# Patient Record
Sex: Female | Born: 1990 | Race: Black or African American | Hispanic: No | Marital: Single | Smoking: Never smoker
Health system: Southern US, Community
[De-identification: ages and names within clinical notes are randomized; demographics above are authoritative.]

## PROBLEM LIST (undated history)

## (undated) HISTORY — PX: MOUTH SURGERY: SHX715

---

## 2019-07-29 ENCOUNTER — Encounter: Payer: Self-pay | Admitting: *Deleted

## 2019-07-29 ENCOUNTER — Ambulatory Visit (INDEPENDENT_AMBULATORY_CARE_PROVIDER_SITE_OTHER): Payer: Medicaid Other | Admitting: Certified Nurse Midwife

## 2019-07-29 ENCOUNTER — Other Ambulatory Visit: Payer: Self-pay

## 2019-07-29 ENCOUNTER — Encounter: Payer: Self-pay | Admitting: Certified Nurse Midwife

## 2019-07-29 ENCOUNTER — Other Ambulatory Visit: Payer: Medicaid Other

## 2019-07-29 ENCOUNTER — Other Ambulatory Visit (HOSPITAL_COMMUNITY)
Admission: RE | Admit: 2019-07-29 | Discharge: 2019-07-29 | Disposition: A | Discharge status: Home / Self Care | Payer: Medicaid Other | Source: Ambulatory Visit | Attending: Certified Nurse Midwife | Admitting: Certified Nurse Midwife

## 2019-07-29 VITALS — BP 113/72 | HR 87 | Ht 70.0 in | Wt 135.0 lb

## 2019-07-29 DIAGNOSIS — Z3400 Encounter for supervision of normal first pregnancy, unspecified trimester: Secondary | ICD-10-CM | POA: Insufficient documentation

## 2019-07-29 DIAGNOSIS — R8271 Bacteriuria: Secondary | ICD-10-CM

## 2019-07-29 DIAGNOSIS — Z3402 Encounter for supervision of normal first pregnancy, second trimester: Secondary | ICD-10-CM

## 2019-07-29 DIAGNOSIS — A568 Sexually transmitted chlamydial infection of other sites: Secondary | ICD-10-CM

## 2019-07-29 DIAGNOSIS — Z3A14 14 weeks gestation of pregnancy: Secondary | ICD-10-CM | POA: Diagnosis not present

## 2019-07-29 HISTORY — DX: Encounter for supervision of normal first pregnancy, unspecified trimester: Z34.00

## 2019-07-29 MED ORDER — BLOOD PRESSURE KIT DEVI: 1.0000 | 0 refills | Status: AC

## 2019-07-29 NOTE — Patient Instructions (Signed)

## 2019-07-29 NOTE — Progress Notes (Signed)
Bedside U/S shows single IUP measuring [redacted]w[redacted]d HC measures 10.18cm

## 2019-07-29 NOTE — Progress Notes (Addendum)
   Subjective:   Colleen Wilcox is a 28 y.o. G1P0 at [redacted]w[redacted]d by LMP confirmed by ultrasound being seen today for her first obstetrical visit.  Patient does intend to breast feed. Pregnancy history fully reviewed.  Patient reports no complaints.  HISTORY: OB History  Gravida Para Term Preterm AB Living  1 0 0 0 0 0  SAB TAB Ectopic Multiple Live Births  0 0 0 0 0    # Outcome Date GA Lbr Len/2nd Weight Sex Delivery Anes PTL Lv  1 Current            History reviewed. No pertinent past medical history. Past Surgical History:  Procedure Laterality Date  . MOUTH SURGERY     Family History  Problem Relation Age of Onset  . Head & neck cancer Mother    Social History   Tobacco Use  . Smoking status: Never Smoker  . Smokeless tobacco: Never Used  Substance Use Topics  . Alcohol use: Not Currently  . Drug use: Never   No Known Allergies No current outpatient medications on file prior to visit.   No current facility-administered medications on file prior to visit.     Indications for ASA therapy (per uptodate) One of the following: Previous pregnancy with preeclampsia, especially early onset and with an adverse outcome No Multifetal gestation No Chronic hypertension No Type 1 or 2 diabetes mellitus No Chronic kidney disease No Autoimmune disease (antiphospholipid syndrome, systemic lupus erythematosus) No  Indications for early 1 hour GTT (per uptodate)  BMI >25 (>23 in Asian women) AND one of the following  Gestational diabetes mellitus in a previous pregnancy No Glycated hemoglobin ?5.7 percent (39 mmol/mol), impaired glucose tolerance, or impaired fasting glucose on previous testing No First-degree relative with diabetes No High-risk race/ethnicity (eg, African American, Latino, Native American, Asian American, Pacific Islander) Yes History of cardiovascular disease No Hypertension or on therapy for hypertension No High-density lipoprotein cholesterol level <35  mg/dL (0.90 mmol/L) and/or a triglyceride level >250 mg/dL (2.82 mmol/L) No Polycystic ovary syndrome No Physical inactivity No Other clinical condition associated with insulin resistance (eg, severe obesity, acanthosis nigricans) No Previous birth of an infant weighing ?4000 g No Previous stillbirth of unknown cause No   Exam   Vitals:   07/29/19 0902 07/29/19 0903  BP: 113/72   Pulse: 87   Weight: 61.2 kg   Height:  5' 10" (1.778 m)   Fetal Heart Rate (bpm): 149  System: General: well-developed, well-nourished female in no acute distress   Breast:  normal appearance, no masses or tenderness   Skin: normal coloration and turgor, no rashes   Neurologic: oriented, normal, negative, normal mood   Extremities: normal strength, tone, and muscle mass, ROM of all joints is normal   HEENT PERRLA, extraocular movement intact and sclera clear, anicteric   Mouth/Teeth mucous membranes moist, pharynx normal without lesions and dental hygiene good   Neck supple and no masses   Cardiovascular: regular rate and rhythm   Respiratory:  no respiratory distress, normal breath sounds   Abdomen: soft, non-tender; bowel sounds normal; no masses,  no organomegaly     Assessment:   Pregnancy: G1P0 Patient Active Problem List   Diagnosis Date Noted  . Supervision of normal first pregnancy 07/29/2019     Plan:  1. Supervision of normal first pregnancy, antepartum - New OB appointment - Pt is very excited about this pregnancy! - No complaints today - Interested in genetic screening - New OB   labs - Anatomy scan ordered   - Obstetric panel - HIV antibody (with reflex) - Cervicovaginal ancillary only( [place]) - [place] bedside; Future - Culture, OB Urine - Babyscripts Schedule Optimization - Blood Pressure Monitoring (BLOOD PRESSURE KIT) DEVI; 1 Device by Does not apply route once a week.  Dispense: 1 Device; Refill: 0 - Procedure Report - Scanned - Procedure Report - Scanned - [place] MFM  OB COMP + 14 WK; Future - Genetic Screening   Initial labs drawn. Early 1 hour GTT: no Started on ASA: no Flu vax: not done Continue prenatal vitamins. Genetic Screening discussed, NIPS: requested. Ultrasound discussed; fetal anatomic survey: ordered. Problem list reviewed and updated The nature of [place] [facility] with multiple MDs and other Advanced Practice Providers was explained to patient; also emphasized that fellows, residents, and students are part of our team. Routine obstetric precautions reviewed Return in about 5 weeks (around 09/02/2019) for ROB/AFP.   Renee Harder, SNM 10:23 AM 07/29/19

## 2019-07-30 ENCOUNTER — Encounter: Payer: Self-pay | Admitting: Certified Nurse Midwife

## 2019-07-30 ENCOUNTER — Encounter: Payer: Self-pay | Admitting: Obstetrics and Gynecology

## 2019-07-30 ENCOUNTER — Other Ambulatory Visit: Payer: Self-pay

## 2019-07-30 DIAGNOSIS — A568 Sexually transmitted chlamydial infection of other sites: Secondary | ICD-10-CM | POA: Insufficient documentation

## 2019-07-30 HISTORY — DX: Sexually transmitted chlamydial infection of other sites: A56.8

## 2019-07-30 LAB — HIV ANTIBODY (ROUTINE TESTING W REFLEX): HIV 1&2 Ab, 4th Generation: NONREACTIVE

## 2019-07-30 LAB — CERVICOVAGINAL ANCILLARY ONLY
Chlamydia: POSITIVE — AB
Molecular Disclaimer: NEGATIVE
Molecular Disclaimer: NORMAL
Neisseria Gonorrhea: NEGATIVE

## 2019-07-30 LAB — OBSTETRIC PANEL
Absolute Monocytes: 589 cells/uL (ref 200–950)
Antibody Screen: NOT DETECTED
Basophils Absolute: 21 cells/uL (ref 0–200)
Basophils Relative: 0.3 %
Eosinophils Absolute: 43 cells/uL (ref 15–500)
Eosinophils Relative: 0.6 %
HCT: 37 % (ref 35.0–45.0)
Hemoglobin: 11.7 g/dL (ref 11.7–15.5)
Hepatitis B Surface Ag: NONREACTIVE
Lymphs Abs: 2499 cells/uL (ref 850–3900)
MCH: 24.5 pg — ABNORMAL LOW (ref 27.0–33.0)
MCHC: 31.6 g/dL — ABNORMAL LOW (ref 32.0–36.0)
MCV: 77.4 fL — ABNORMAL LOW (ref 80.0–100.0)
MPV: 11.1 fL (ref 7.5–12.5)
Monocytes Relative: 8.3 %
Neutro Abs: 3948 cells/uL (ref 1500–7800)
Neutrophils Relative %: 55.6 %
Platelets: 244 10*3/uL (ref 140–400)
RBC: 4.78 10*6/uL (ref 3.80–5.10)
RDW: 17.8 % — ABNORMAL HIGH (ref 11.0–15.0)
RPR Ser Ql: NONREACTIVE
Rubella: 2.15 index
Total Lymphocyte: 35.2 %
WBC: 7.1 10*3/uL (ref 3.8–10.8)

## 2019-07-30 MED ORDER — AZITHROMYCIN 500 MG PO TABS: 1000.0000 mg | ORAL_TABLET | Freq: Once | ORAL | 0 refills | Status: AC

## 2019-07-30 NOTE — Addendum Note (Signed)
Addended by: Lajean Manes on: 07/30/2019 06:48 PM   Modules accepted: Orders

## 2019-08-03 DIAGNOSIS — R8271 Bacteriuria: Secondary | ICD-10-CM | POA: Insufficient documentation

## 2019-08-03 LAB — URINE CULTURE, OB REFLEX

## 2019-08-03 LAB — CULTURE, OB URINE

## 2019-08-03 MED ORDER — AMOXICILLIN 500 MG PO CAPS: 500.0000 mg | ORAL_CAPSULE | Freq: Three times a day (TID) | ORAL | 0 refills | Status: AC

## 2019-08-03 NOTE — Addendum Note (Signed)
Addended by: Lajean Manes on: 08/03/2019 01:39 PM   Modules accepted: Orders

## 2019-08-04 ENCOUNTER — Encounter: Payer: Self-pay | Admitting: *Deleted

## 2019-08-04 DIAGNOSIS — Z3402 Encounter for supervision of normal first pregnancy, second trimester: Secondary | ICD-10-CM

## 2019-09-02 ENCOUNTER — Ambulatory Visit (HOSPITAL_COMMUNITY)
Admission: RE | Admit: 2019-09-02 | Discharge: 2019-09-02 | Disposition: A | Discharge status: Home / Self Care | Payer: Medicaid Other | Source: Ambulatory Visit | Attending: Obstetrics and Gynecology | Admitting: Obstetrics and Gynecology

## 2019-09-02 ENCOUNTER — Other Ambulatory Visit: Payer: Self-pay

## 2019-09-02 ENCOUNTER — Other Ambulatory Visit (HOSPITAL_COMMUNITY)
Admission: RE | Admit: 2019-09-02 | Discharge: 2019-09-02 | Disposition: A | Discharge status: Home / Self Care | Payer: Medicaid Other | Source: Ambulatory Visit | Attending: Advanced Practice Midwife | Admitting: Advanced Practice Midwife

## 2019-09-02 ENCOUNTER — Ambulatory Visit (INDEPENDENT_AMBULATORY_CARE_PROVIDER_SITE_OTHER): Payer: Medicaid Other | Admitting: Advanced Practice Midwife

## 2019-09-02 VITALS — BP 99/55 | HR 75 | Wt 145.0 lb

## 2019-09-02 DIAGNOSIS — Z3400 Encounter for supervision of normal first pregnancy, unspecified trimester: Secondary | ICD-10-CM | POA: Diagnosis not present

## 2019-09-02 DIAGNOSIS — Z3A19 19 weeks gestation of pregnancy: Secondary | ICD-10-CM | POA: Diagnosis not present

## 2019-09-02 DIAGNOSIS — A749 Chlamydial infection, unspecified: Secondary | ICD-10-CM | POA: Insufficient documentation

## 2019-09-02 DIAGNOSIS — Z3482 Encounter for supervision of other normal pregnancy, second trimester: Secondary | ICD-10-CM | POA: Diagnosis not present

## 2019-09-02 DIAGNOSIS — Z363 Encounter for antenatal screening for malformations: Secondary | ICD-10-CM

## 2019-09-02 DIAGNOSIS — Z3480 Encounter for supervision of other normal pregnancy, unspecified trimester: Secondary | ICD-10-CM

## 2019-09-02 NOTE — Progress Notes (Signed)
   PRENATAL VISIT NOTE  Subjective:  Colleen Wilcox is a 28 y.o. G1P0 at [redacted]w[redacted]d being seen today for ongoing prenatal care.  She is currently monitored for the following issues for this low-risk pregnancy and has Supervision of normal first pregnancy; Chlamydia trachomatis infection during pregnancy in second trimester; and GBS bacteriuria on their problem list.  Patient reports no complaints.   . Vag. Bleeding: None.  Movement: Absent. Denies leaking of fluid.   The following portions of the patient's history were reviewed and updated as appropriate: allergies, current medications, past family history, past medical history, past social history, past surgical history and problem list.   Objective:   Vitals:   09/02/19 1304  BP: (!) 99/55  Pulse: 75  Weight: 65.8 kg    Fetal Status: Fetal Heart Rate (bpm): 146   Movement: Absent  FH at umbilicus today  General:  Alert, oriented and cooperative. Patient is in no acute distress.  Skin: Skin is warm and dry. No rash noted.   Cardiovascular: Normal heart rate noted  Respiratory: Normal respiratory effort, no problems with respiration noted  Abdomen: Soft, gravid, appropriate for gestational age.  Pain/Pressure: Absent     Pelvic: Cervical exam deferred        Extremities: Normal range of motion.  Edema: None  Mental Status: Normal mood and affect. Normal behavior. Normal judgment and thought content.   Assessment and Plan:  Pregnancy: G1P0 at [redacted]w[redacted]d 1. Supervision of other normal pregnancy, antepartum No complaints today, pt is doing well Anticipatory guidance given for next weeks of pregnancy Follow up at 26 weeks for glucose testing - Alpha fetoprotein, maternal  2. Positive Chlamyida test TOC today - Cervicovaginal ancillary only( [place])  Preterm labor symptoms and general obstetric precautions including but not limited to vaginal bleeding, contractions, leaking of fluid and fetal movement were reviewed in detail with the  patient. Please refer to After Visit Summary for other counseling recommendations.   Return in about 7 weeks (around 10/21/2019).  Future Appointments  Date Time Provider [place]  10/21/2019  8:15 AM Lajean Manes, CNM [facility]    [place] Mari Battaglia, Student-PA

## 2019-09-03 LAB — ALPHA FETOPROTEIN, MATERNAL
AFP MoM: 0.88
AFP, Serum: 53.7 ng/mL
Calc'd Gestational Age: 19.6 weeks
Maternal Wt: 145 [lb_av]
Risk for ONTD: <1
Twins-AFP: 1

## 2019-09-03 LAB — CERVICOVAGINAL ANCILLARY ONLY
Chlamydia: NEGATIVE
Comment: NEGATIVE
Comment: NORMAL
Neisseria Gonorrhea: NEGATIVE

## 2019-10-21 ENCOUNTER — Encounter: Payer: Self-pay | Admitting: Certified Nurse Midwife

## 2019-10-21 ENCOUNTER — Ambulatory Visit (INDEPENDENT_AMBULATORY_CARE_PROVIDER_SITE_OTHER): Payer: Medicaid Other | Admitting: Certified Nurse Midwife

## 2019-10-21 ENCOUNTER — Other Ambulatory Visit: Payer: Self-pay

## 2019-10-21 VITALS — BP 95/57 | HR 72 | Temp 98.3°F | Wt 155.0 lb

## 2019-10-21 DIAGNOSIS — A568 Sexually transmitted chlamydial infection of other sites: Secondary | ICD-10-CM

## 2019-10-21 DIAGNOSIS — Z3402 Encounter for supervision of normal first pregnancy, second trimester: Secondary | ICD-10-CM

## 2019-10-21 DIAGNOSIS — O99012 Anemia complicating pregnancy, second trimester: Secondary | ICD-10-CM

## 2019-10-21 DIAGNOSIS — D509 Iron deficiency anemia, unspecified: Secondary | ICD-10-CM

## 2019-10-21 DIAGNOSIS — R8271 Bacteriuria: Secondary | ICD-10-CM | POA: Diagnosis not present

## 2019-10-21 DIAGNOSIS — Z3A26 26 weeks gestation of pregnancy: Secondary | ICD-10-CM

## 2019-10-21 DIAGNOSIS — O98312 Other infections with a predominantly sexual mode of transmission complicating pregnancy, second trimester: Secondary | ICD-10-CM | POA: Diagnosis not present

## 2019-10-21 NOTE — Progress Notes (Signed)
   PRENATAL VISIT NOTE  Subjective:  Colleen Wilcox is a 28 y.o. G1P0 at [redacted]w[redacted]d being seen today for ongoing prenatal care.  She is currently monitored for the following issues for this low-risk pregnancy and has Supervision of normal first pregnancy; Chlamydia trachomatis infection during pregnancy in second trimester; and GBS bacteriuria on their problem list.  Patient reports no complaints.   . Vag. Bleeding: None.  Movement: Present. Denies leaking of fluid.   The following portions of the patient's history were reviewed and updated as appropriate: allergies, current medications, past family history, past medical history, past social history, past surgical history and problem list.   Objective:   Vitals:   10/21/19 0809  BP: (!) 95/57  Pulse: 72  Temp: 98.3 F (36.8 C)  Weight: 155 lb (70.3 kg)    Fetal Status: Fetal Heart Rate (bpm): 143 Fundal Height: 28 cm Movement: Present     General:  Alert, oriented and cooperative. Patient is in no acute distress.  Skin: Skin is warm and dry. No rash noted.   Cardiovascular: Normal heart rate noted  Respiratory: Normal respiratory effort, no problems with respiration noted  Abdomen: Soft, gravid, appropriate for gestational age.  Pain/Pressure: Absent     Pelvic: Cervical exam deferred        Extremities: Normal range of motion.  Edema: None  Mental Status: Normal mood and affect. Normal behavior. Normal judgment and thought content.   Assessment and Plan:  Pregnancy: G1P0 at [redacted]w[redacted]d 1. Encounter for supervision of normal first pregnancy in second trimester - Patient doing well, no complaints - Routine prenatal care  - Anticipatory guidance on upcoming appointments with next being mychart visit - Patient reports that she is planning natural labor and delivery - does not want medication or epidural, discussed plans of birth being written in birth plan to discuss 34-36 weeks  - 2 Hour GTT - CBC - RPR - HIV antibody (with reflex)  2.  Chlamydia trachomatis infection during pregnancy in second trimester - + on 9/29- TOC on 09/02/19 negative  - Patient denies cramping, discharge or vaginal bleeding   3. GBS bacteriuria - treat in labor   Preterm labor symptoms and general obstetric precautions including but not limited to vaginal bleeding, contractions, leaking of fluid and fetal movement were reviewed in detail with the patient. Please refer to After Visit Summary for other counseling recommendations.   Return in about 4 weeks (around 11/18/2019) for ROB-mychart.  Future Appointments  Date Time Provider [place]  11/18/2019  1:15 PM Julianne Handler, [place] [facility]    Lajean Manes, CNM

## 2019-10-21 NOTE — Patient Instructions (Signed)
Glucose Tolerance Test During Pregnancy Why am I having this test? The glucose tolerance test (GTT) is done to check how your body processes sugar (glucose). This is one of several tests used to diagnose diabetes that develops during pregnancy (gestational diabetes mellitus). Gestational diabetes is a temporary form of diabetes that some women develop during pregnancy. It usually occurs during the second trimester of pregnancy and goes away after delivery. Testing (screening) for gestational diabetes usually occurs between 24 and 28 weeks of pregnancy. You may have the GTT test after having a 1-hour glucose screening test if the results from that test indicate that you may have gestational diabetes. You may also have this test if:  You have a history of gestational diabetes.  You have a history of giving birth to very large babies or have experienced repeated fetal loss (stillbirth).  You have signs and symptoms of diabetes, such as: ? Changes in your vision. ? Tingling or numbness in your hands or feet. ? Changes in hunger, thirst, and urination that are not otherwise explained by your pregnancy. What is being tested? This test measures the amount of glucose in your blood at different times during a period of 3 hours. This indicates how well your body is able to process glucose. What kind of sample is taken?  Blood samples are required for this test. They are usually collected by inserting a needle into a blood vessel. How do I prepare for this test?  For 3 days before your test, eat normally. Have plenty of carbohydrate-rich foods.  Follow instructions from your health care provider about: ? Eating or drinking restrictions on the day of the test. You may be asked to not eat or drink anything other than water (fast) starting 8-10 hours before the test. ? Changing or stopping your regular medicines. Some medicines may interfere with this test. Tell a health care provider about:  All  medicines you are taking, including vitamins, herbs, eye drops, creams, and over-the-counter medicines.  Any blood disorders you have.  Any surgeries you have had.  Any medical conditions you have. What happens during the test? First, your blood glucose will be measured. This is referred to as your fasting blood glucose, since you fasted before the test. Then, you will drink a glucose solution that contains a certain amount of glucose. Your blood glucose will be measured again 1, 2, and 3 hours after drinking the solution. This test takes about 3 hours to complete. You will need to stay at the testing location during this time. During the testing period:  Do not eat or drink anything other than the glucose solution.  Do not exercise.  Do not use any products that contain nicotine or tobacco, such as cigarettes and e-cigarettes. If you need help stopping, ask your health care provider. The testing procedure may vary among health care providers and hospitals. How are the results reported? Your results will be reported as milligrams of glucose per deciliter of blood (mg/dL) or millimoles per liter (mmol/L). Your health care provider will compare your results to normal ranges that were established after testing a large group of people (reference ranges). Reference ranges may vary among labs and hospitals. For this test, common reference ranges are:  Fasting: less than 95-105 mg/dL (5.3-5.8 mmol/L).  1 hour after drinking glucose: less than 180-190 mg/dL (10.0-10.5 mmol/L).  2 hours after drinking glucose: less than 155-165 mg/dL (8.6-9.2 mmol/L).  3 hours after drinking glucose: 140-145 mg/dL (7.8-8.1 mmol/L). What do the   results mean? Results within reference ranges are considered normal, meaning that your glucose levels are well-controlled. If two or more of your blood glucose levels are high, you may be diagnosed with gestational diabetes. If only one level is high, your health care  provider may suggest repeat testing or other tests to confirm a diagnosis. Talk with your health care provider about what your results mean. Questions to ask your health care provider Ask your health care provider, or the department that is doing the test:  When will my results be ready?  How will I get my results?  What are my treatment options?  What other tests do I need?  What are my next steps? Summary  The glucose tolerance test (GTT) is one of several tests used to diagnose diabetes that develops during pregnancy (gestational diabetes mellitus). Gestational diabetes is a temporary form of diabetes that some women develop during pregnancy.  You may have the GTT test after having a 1-hour glucose screening test if the results from that test indicate that you may have gestational diabetes. You may also have this test if you have any symptoms or risk factors for gestational diabetes.  Talk with your health care provider about what your results mean. This information is not intended to replace advice given to you by your health care provider. Make sure you discuss any questions you have with your health care provider. Document Released: 04/16/2012 Document Revised: 02/06/2019 Document Reviewed: 05/28/2017 Elsevier Patient Education  2020 Elsevier Inc.  

## 2019-10-22 LAB — RPR: RPR Ser Ql: NONREACTIVE

## 2019-10-22 LAB — CBC
HCT: 31.1 % — ABNORMAL LOW (ref 35.0–45.0)
Hemoglobin: 10.1 g/dL — ABNORMAL LOW (ref 11.7–15.5)
MCH: 25.1 pg — ABNORMAL LOW (ref 27.0–33.0)
MCHC: 32.5 g/dL (ref 32.0–36.0)
MCV: 77.4 fL — ABNORMAL LOW (ref 80.0–100.0)
MPV: 11.5 fL (ref 7.5–12.5)
Platelets: 195 10*3/uL (ref 140–400)
RBC: 4.02 10*6/uL (ref 3.80–5.10)
RDW: 13.4 % (ref 11.0–15.0)
WBC: 7.3 10*3/uL (ref 3.8–10.8)

## 2019-10-22 LAB — 2HR GTT W 1 HR, CARPENTER, 75 G
Glucose, 1 Hr, Gest: 102 mg/dL (ref 65–179)
Glucose, 2 Hr, Gest: 77 mg/dL (ref 65–152)
Glucose, Fasting, Gest: 72 mg/dL (ref 65–91)

## 2019-10-22 LAB — HIV ANTIBODY (ROUTINE TESTING W REFLEX): HIV 1&2 Ab, 4th Generation: NONREACTIVE

## 2019-10-22 MED ORDER — FERROUS SULFATE 325 (65 FE) MG PO TABS: 325.0000 mg | ORAL_TABLET | Freq: Two times a day (BID) | ORAL | 2 refills | Status: DC

## 2019-10-22 NOTE — Addendum Note (Signed)
Addended by: Lajean Manes on: 10/22/2019 08:57 AM   Modules accepted: Orders

## 2019-10-31 NOTE — L&D Delivery Note (Signed)
OB/GYN Faculty Practice Delivery Note  Colleen Wilcox is a 29 y.o. G1P0 s/p VD at [redacted]w[redacted]d. She was admitted for SOL.   ROM: 0h 51m with clear fluid GBS Status: Positive; Amp given (inadequate ppx) Maximum Maternal Temperature: 98.58F  Labor Progress: . Patient presented to L&D for SOL. Initial SVE: 9/90/-2 with bulging bag. SROM occurred. She then progressed to complete.   Delivery Date/Time: 3/26 @ [phone] Delivery: Called to room and patient was complete and pushing. From hands and knees position, head delivered in LOA position. No nuchal cord present. Shoulder and body delivered in usual fashion. Infant with spontaneous cry and held until mother turned and could be placed on mother's abdomen where it could be dried and stimulated. Cord clamped x 2 after 2-minute delay, and cut by maternal father. Cord blood drawn. Brisk bleeding with what appeared to be more extensive laceration; Dr. Marice Potter called to room. Placenta delivered spontaneously with gentle cord traction. Fundus firm with massage and Pitocin. Labia, perineum, vagina, and cervix inspected inspected with 2nd degree perineal laceration (skin extension near rectum) and bilateral deep sulcal tears. Repaired with 3-0 Vicryl in a standard fashion. All counts correct. Baby Weight: pending  Placenta: Sent to L&D Complications: None Lacerations: 2nd degree perineal  EBL: 980 mL Analgesia: Local lidocaine and IV Fentanyl given for repair    Infant:  APGAR (1 MIN): 8  APGAR (5 MINS): 9  APGAR (10 MINS):     Jerilynn Birkenhead, MD OB Family Medicine Fellow, [facility] for [facility], [facility] 01/23/2020, 6:00 AM

## 2019-11-18 ENCOUNTER — Telehealth (INDEPENDENT_AMBULATORY_CARE_PROVIDER_SITE_OTHER): Payer: Medicaid Other | Admitting: Certified Nurse Midwife

## 2019-11-18 DIAGNOSIS — Z3A30 30 weeks gestation of pregnancy: Secondary | ICD-10-CM

## 2019-11-18 DIAGNOSIS — A568 Sexually transmitted chlamydial infection of other sites: Secondary | ICD-10-CM

## 2019-11-18 DIAGNOSIS — Z3402 Encounter for supervision of normal first pregnancy, second trimester: Secondary | ICD-10-CM

## 2019-11-18 DIAGNOSIS — O98313 Other infections with a predominantly sexual mode of transmission complicating pregnancy, third trimester: Secondary | ICD-10-CM

## 2019-11-18 NOTE — Progress Notes (Signed)
Pt has not been doing wts, states her scale is broken

## 2019-11-18 NOTE — Patient Instructions (Signed)
Fetal Movement Counts Patient Name: ________________________________________________ Patient Due Date: ____________________ What is a fetal movement count?  A fetal movement count is the number of times that you feel your baby move during a certain amount of time. This may also be called a fetal kick count. A fetal movement count is recommended for every pregnant woman. You may be asked to start counting fetal movements as early as week 28 of your pregnancy. Pay attention to when your baby is most active. You may notice your baby's sleep and wake cycles. You may also notice things that make your baby move more. You should do a fetal movement count:  When your baby is normally most active.  At the same time each day. A good time to count movements is while you are resting, after having something to eat and drink. How do I count fetal movements? 1. Find a quiet, comfortable area. Sit, or lie down on your side. 2. Write down the date, the start time and stop time, and the number of movements that you felt between those two times. Take this information with you to your health care visits. 3. Write down your start time when you feel the first movement. 4. Count kicks, flutters, swishes, rolls, and jabs. You should feel at least 10 movements. 5. You may stop counting after you have felt 10 movements, or if you have been counting for 2 hours. Write down the stop time. 6. If you do not feel 10 movements in 2 hours, contact your health care provider for further instructions. Your health care provider may want to do additional tests to assess your baby's well-being. Contact a health care provider if:  You feel fewer than 10 movements in 2 hours.  Your baby is not moving like he or she usually does. Date: ____________ Start time: ____________ Stop time: ____________ Movements: ____________ Date: ____________ Start time: ____________ Stop time: ____________ Movements: ____________ Date: ____________  Start time: ____________ Stop time: ____________ Movements: ____________ Date: ____________ Start time: ____________ Stop time: ____________ Movements: ____________ Date: ____________ Start time: ____________ Stop time: ____________ Movements: ____________ Date: ____________ Start time: ____________ Stop time: ____________ Movements: ____________ Date: ____________ Start time: ____________ Stop time: ____________ Movements: ____________ Date: ____________ Start time: ____________ Stop time: ____________ Movements: ____________ Date: ____________ Start time: ____________ Stop time: ____________ Movements: ____________ This information is not intended to replace advice given to you by your health care provider. Make sure you discuss any questions you have with your health care provider. Document Revised: 06/05/2019 Document Reviewed: 06/05/2019 Elsevier Patient Education  [place] [facility]  [facility] [address] [place], [place] [address] Phone - [phone]   Fax - [place] [address], [place]  [address] Phone - [phone]   Fax - [phone]  [place] [address] [place] [address] [place], [place]  [address] Phone - [phone]   Fax - [phone]  [facility] [address] [place], [place]  [address] Phone - [phone]   Fax - [phone]  [place] [address], [place], [place]  [address] Phone - [phone]   Fax - [place] [address] [place], [place]  [address] Phone - [phone]   Fax - [place] [facility] [place] [address] [place], [place]  [address] Phone - [phone]   Fax [phone]  [facility] [place] [address] [place], [place] [phone]  [facility] AT  [facility] [address] [place], [address] [place], [place]  [address] Phone - [phone]   Fax - [phone]  [facility] AT [facility] [address] [place], [place]  [address] Phone - [phone]   Fax - [phone] [facility] AT [place] [address] [place], [place]  [address] Phone - [phone]   Fax - [phone]  [facility] [address] [place], [place]  [address] Phone - [phone]   Fax - [phone]  [facility] [address], [address] [place], [place]  [address] Phone - [phone]   Fax - [phone]  [facility] [place] [address] [place], [place]  [address] Phone - [phone]   Fax - [phone]  [facility] [address], [address] [place], [place]  [address] Phone - [phone]  [facility] [address] [place], [place]  [address] Phone - [phone]   Fax - [phone]  [facility] [address] [place], [place]  [address] Phone - [phone]   Fax - [phone]  [facility] [address] [place], [place]  [address] Phone - [phone]   Fax - [phone]  [place] [facility] [address] [place], [place]  [address] Phone - [phone]   Fax - [phone]   [facility] [address] [place] [address] [place], [place]  [address] Phone - [phone]   Fax - [phone]  [facility] [address] [place], [place]  [address] Phone - [phone]   Fax - [phone]  [facility] [address] [place], [place]  [address] Phone - [phone]   Fax - [phone]  [facility] [address] [place], [place]  [address] Phone - [phone]   Fax - [phone]  [place] [facility] [place] [place] [address] [place], [place]  [address] Phone - [phone]   Fax -  [phone] Gerarda [place] [address] [place], [place]  [address] Phone - [phone]   Fax - [phone]  [facility] [address] [place], [place]  [address] Phone - [phone]   Fax - [phone]  [facility] - [place] [address] [place], [place]  [address] Phone - [phone]   Fax - [phone]

## 2019-11-18 NOTE — Progress Notes (Addendum)
   TELEHEALTH OBSTETRICS PRENATAL VIRTUAL VIDEO VISIT ENCOUNTER NOTE  Provider location: [facility] [employer] at [place]   I connected with Amador Cunas on 11/18/19 at  1:15 PM EST by MyChart Video Encounter at home and verified that I am speaking with the correct person using two identifiers.   I discussed the limitations, risks, security and privacy concerns of performing an evaluation and management service virtually and the availability of in person appointments. I also discussed with the patient that there may be a patient responsible charge related to this service. The patient expressed understanding and agreed to proceed. Subjective:  Colleen Wilcox is a 29 y.o. G1P0 at [redacted]w[redacted]d being seen today for ongoing prenatal care.  She is currently monitored for the following issues for this low-risk pregnancy and has Supervision of normal first pregnancy; Chlamydia trachomatis infection during pregnancy in second trimester; and GBS bacteriuria on their problem list.  Patient reports no complaints. Has some occasional pelvic pressure in mornings when getting out of bed. Resolves with use of support belt. She denies contractions or cramping. Vag. Bleeding: None.  Movement: Present. Denies any leaking of fluid.   The following portions of the patient's history were reviewed and updated as appropriate: allergies, current medications, past family history, past medical history, past social history, past surgical history and problem list.   Objective:   Vitals:   11/18/19 1258  BP: (!) 105/59  Pulse: 88    Fetal Status:     Movement: Present     General:  Alert, oriented and cooperative. Patient is in no acute distress.  Respiratory: Normal respiratory effort, no problems with respiration noted  Mental Status: Normal mood and affect. Normal behavior. Normal judgment and thought content.  Rest of physical exam deferred due to type of encounter  Imaging: No results found.  Assessment  and Plan:  Pregnancy: G1P0 at [redacted]w[redacted]d 1. Encounter for supervision of normal first pregnancy in third trimester - Routine OB visit. - Doing well. No complaints today.  - Anticipatory guidance for upcoming appointments and visits  Preterm labor symptoms and general obstetric precautions including but not limited to vaginal bleeding, contractions, leaking of fluid and fetal movement were reviewed in detail with the patient. I discussed the assessment and treatment plan with the patient. The patient was provided an opportunity to ask questions and all were answered. The patient agreed with the plan and demonstrated an understanding of the instructions. The patient was advised to call back or seek an in-person office evaluation/go to MAU at [facility] for any urgent or concerning symptoms. Please refer to After Visit Summary for other counseling recommendations.   I provided 11 minutes of face-to-face time during this encounter.  Return in about 2 weeks (around 12/02/2019).     Camelia Eng, SNM [facility], [facility]  I confirm that I have verified the information documented in the nurse midwife student's note and that I have also personally reperformed the history, physical exam and all medical decision making activities of this service and have verified that all service and findings are accurately documented in this student's note.    Donette Larry, CNM 11/18/2019 3:15 PM

## 2019-12-05 ENCOUNTER — Telehealth (INDEPENDENT_AMBULATORY_CARE_PROVIDER_SITE_OTHER): Payer: Medicaid Other | Admitting: Obstetrics and Gynecology

## 2019-12-05 DIAGNOSIS — O98313 Other infections with a predominantly sexual mode of transmission complicating pregnancy, third trimester: Secondary | ICD-10-CM

## 2019-12-05 DIAGNOSIS — Z3A33 33 weeks gestation of pregnancy: Secondary | ICD-10-CM

## 2019-12-05 DIAGNOSIS — Z3402 Encounter for supervision of normal first pregnancy, second trimester: Secondary | ICD-10-CM

## 2019-12-05 DIAGNOSIS — A568 Sexually transmitted chlamydial infection of other sites: Secondary | ICD-10-CM

## 2019-12-05 NOTE — Progress Notes (Signed)
   TELEHEALTH OBSTETRICS PRENATAL VIRTUAL VIDEO VISIT ENCOUNTER NOTE  Provider location: [facility] [employer] [facility] [place]   I connected with Colleen Wilcox on 12/05/19 at 11:15 AM EST by WebEx Encounter at home and verified that I am speaking with the correct person using two identifiers.   I discussed the limitations, risks, security and privacy concerns of performing an evaluation and management service virtually and the availability of in person appointments. I also discussed with the patient that there may be a patient responsible charge related to this service. The patient expressed understanding and agreed to proceed. Subjective:  Colleen Wilcox is a 29 y.o. G1P0 at [redacted]w[redacted]d being seen today for ongoing prenatal care.  She is currently monitored for the following issues for this low-risk pregnancy and has Supervision of normal first pregnancy; Chlamydia trachomatis infection during pregnancy in second trimester; and GBS bacteriuria on their problem list.  Patient reports no complaints.  Contractions: Not present. Vag. Bleeding: None.  Movement: Present. Denies any leaking of fluid.   The following portions of the patient's history were reviewed and updated as appropriate: allergies, current medications, past family history, past medical history, past social history, past surgical history and problem list.   Objective:   Vitals:   12/05/19 1109  BP: 101/64  Pulse: 86    Fetal Status:     Movement: Present     General:  Alert, oriented and cooperative. Patient is in no acute distress.  Respiratory: Normal respiratory effort, no problems with respiration noted  Mental Status: Normal mood and affect. Normal behavior. Normal judgment and thought content.  Rest of physical exam deferred due to type of encounter  Imaging: No results found.  Assessment and Plan:  Pregnancy: G1P0 at [redacted]w[redacted]d 1. Chlamydia trachomatis infection during pregnancy in second trimester   2. Encounter  for supervision of normal first pregnancy in second trimester  Discussed birthing classes and hospital tour Discussed TDAP and encouraged this at next visit. She is doing well.   Preterm labor symptoms and general obstetric precautions including but not limited to vaginal bleeding, contractions, leaking of fluid and fetal movement were reviewed in detail with the patient. I discussed the assessment and treatment plan with the patient. The patient was provided an opportunity to ask questions and all were answered. The patient agreed with the plan and demonstrated an understanding of the instructions. The patient was advised to call back or seek an in-person office evaluation/go to MAU at [facility] for any urgent or concerning symptoms. Please refer to After Visit Summary for other counseling recommendations.   I provided 10 minutes of face-to-face time during this encounter.  Return in about 2 weeks (around 12/19/2019) for In person, after 35 weeks for cultures. .  No future appointments.  Venia Carbon, NP [facility] [employer], [facility]

## 2019-12-05 NOTE — Patient Instructions (Signed)

## 2019-12-17 ENCOUNTER — Ambulatory Visit (INDEPENDENT_AMBULATORY_CARE_PROVIDER_SITE_OTHER): Payer: Medicaid Other | Admitting: Nurse Practitioner

## 2019-12-17 ENCOUNTER — Other Ambulatory Visit: Payer: Self-pay

## 2019-12-17 ENCOUNTER — Other Ambulatory Visit (HOSPITAL_COMMUNITY)
Admission: RE | Admit: 2019-12-17 | Discharge: 2019-12-17 | Disposition: A | Discharge status: Home / Self Care | Payer: Medicaid Other | Source: Ambulatory Visit | Attending: Nurse Practitioner | Admitting: Nurse Practitioner

## 2019-12-17 VITALS — BP 112/78 | HR 78 | Temp 98.0°F | Wt 157.0 lb

## 2019-12-17 DIAGNOSIS — Z3A34 34 weeks gestation of pregnancy: Secondary | ICD-10-CM

## 2019-12-17 DIAGNOSIS — Z23 Encounter for immunization: Secondary | ICD-10-CM

## 2019-12-17 DIAGNOSIS — Z3402 Encounter for supervision of normal first pregnancy, second trimester: Secondary | ICD-10-CM | POA: Insufficient documentation

## 2019-12-17 NOTE — Progress Notes (Signed)
    Subjective:  Colleen Wilcox is a 29 y.o. G1P0 at [redacted]w[redacted]d being seen today for ongoing prenatal care.  She is currently monitored for the following issues for this low-risk pregnancy and has Supervision of normal first pregnancy; Chlamydia trachomatis infection during pregnancy in second trimester; and GBS bacteriuria on their problem list.  Patient reports vaginal discharge.  Contractions: Not present. Vag. Bleeding: None.  Movement: Present. Denies leaking of fluid.   The following portions of the patient's history were reviewed and updated as appropriate: allergies, current medications, past family history, past medical history, past social history, past surgical history and problem list. Problem list updated.  Objective:   Vitals:   12/17/19 1020  BP: 112/78  Pulse: 78  Temp: 98 F (36.7 C)  Weight: 157 lb (71.2 kg)    Fetal Status: Fetal Heart Rate (bpm): 138 Fundal Height: 39 cm Movement: Present     General:  Alert, oriented and cooperative. Patient is in no acute distress.  Skin: Skin is warm and dry. No rash noted.   Cardiovascular: Normal heart rate noted  Respiratory: Normal respiratory effort, no problems with respiration noted  Abdomen: Soft, gravid, appropriate for gestational age. Pain/Pressure: Absent     Pelvic:  Cervical exam deferred        Extremities: Normal range of motion.  Edema: None  Mental Status: Normal mood and affect. Normal behavior. Normal judgment and thought content.   Urinalysis:      Assessment and Plan:  Pregnancy: G1P0 at [redacted]w[redacted]d  1. Encounter for supervision of normal first pregnancy in second trimester Got TDAP Advised to check out online breastfeeding and childbirth information Advised to call pediatrician in [place] that she wants to use Having vaginal discharge today so self swab done but will need vaginal swabs at 36 weeks. Measures larger than dates but baby is sitting high in pelvis - will watch fundal height  2.  Vaginal  discharge Self swab done today   Preterm labor symptoms and general obstetric precautions including but not limited to vaginal bleeding, contractions, leaking of fluid and fetal movement were reviewed in detail with the patient. Please refer to After Visit Summary for other counseling recommendations.  Return in about 9 days (around 12/26/2019) for scheduled appt at 36 weeks in person for vaginal swabs.  Nolene Bernheim, RN, MSN, NP-BC Nurse Practitioner, [facility] for [employer], [facility] 12/17/2019 10:51 AM

## 2019-12-17 NOTE — Patient Instructions (Signed)
Braxton Hicks Contractions °Contractions of the uterus can occur throughout pregnancy, but they are not always a sign that you are in labor. You may have practice contractions called Braxton Hicks contractions. These false labor contractions are sometimes confused with true labor. °What are Braxton Hicks contractions? °Braxton Hicks contractions are tightening movements that occur in the muscles of the uterus before labor. Unlike true labor contractions, these contractions do not result in opening (dilation) and thinning of the cervix. Toward the end of pregnancy (32-34 weeks), Braxton Hicks contractions can happen more often and may become stronger. These contractions are sometimes difficult to tell apart from true labor because they can be very uncomfortable. You should not feel embarrassed if you go to the hospital with false labor. °Sometimes, the only way to tell if you are in true labor is for your health care provider to look for changes in the cervix. The health care provider will do a physical exam and may monitor your contractions. If you are not in true labor, the exam should show that your cervix is not dilating and your water has not broken. °If there are no other health problems associated with your pregnancy, it is completely safe for you to be sent home with false labor. You may continue to have Braxton Hicks contractions until you go into true labor. °How to tell the difference between true labor and false labor °True labor °· Contractions last 30-70 seconds. °· Contractions become very regular. °· Discomfort is usually felt in the top of the uterus, and it spreads to the lower abdomen and low back. °· Contractions do not go away with walking. °· Contractions usually become more intense and increase in frequency. °· The cervix dilates and gets thinner. °False labor °· Contractions are usually shorter and not as strong as true labor contractions. °· Contractions are usually irregular. °· Contractions  are often felt in the front of the lower abdomen and in the groin. °· Contractions may go away when you walk around or change positions while lying down. °· Contractions get weaker and are shorter-lasting as time goes on. °· The cervix usually does not dilate or become thin. °Follow these instructions at home: ° °· Take over-the-counter and prescription medicines only as told by your health care provider. °· Keep up with your usual exercises and follow other instructions from your health care provider. °· Eat and drink lightly if you think you are going into labor. °· If Braxton Hicks contractions are making you uncomfortable: °? Change your position from lying down or resting to walking, or change from walking to resting. °? Sit and rest in a tub of warm water. °? Drink enough fluid to keep your urine pale yellow. Dehydration may cause these contractions. °? Do slow and deep breathing several times an hour. °· Keep all follow-up prenatal visits as told by your health care provider. This is important. °Contact a health care provider if: °· You have a fever. °· You have continuous pain in your abdomen. °Get help right away if: °· Your contractions become stronger, more regular, and closer together. °· You have fluid leaking or gushing from your vagina. °· You pass blood-tinged mucus (bloody show). °· You have bleeding from your vagina. °· You have low back pain that you never had before. °· You feel your baby’s head pushing down and causing pelvic pressure. °· Your baby is not moving inside you as much as it used to. °Summary °· Contractions that occur before labor are   called Braxton Hicks contractions, false labor, or practice contractions. °· Braxton Hicks contractions are usually shorter, weaker, farther apart, and less regular than true labor contractions. True labor contractions usually become progressively stronger and regular, and they become more frequent. °· Manage discomfort from Braxton Hicks contractions  by changing position, resting in a warm bath, drinking plenty of water, or practicing deep breathing. °This information is not intended to replace advice given to you by your health care provider. Make sure you discuss any questions you have with your health care provider. °Document Revised: 09/28/2017 Document Reviewed: 03/01/2017 °Elsevier Patient Education © 2020 Elsevier Inc. ° °

## 2019-12-18 LAB — CERVICOVAGINAL ANCILLARY ONLY
Bacterial Vaginitis (gardnerella): POSITIVE — AB
Candida Glabrata: NEGATIVE
Candida Vaginitis: NEGATIVE
Chlamydia: NEGATIVE
Comment: NEGATIVE
Comment: NEGATIVE
Comment: NEGATIVE
Comment: NEGATIVE
Comment: NEGATIVE
Comment: NORMAL
Neisseria Gonorrhea: NEGATIVE
Trichomonas: POSITIVE — AB

## 2019-12-18 MED ORDER — METRONIDAZOLE 500 MG PO TABS: 500.0000 mg | ORAL_TABLET | Freq: Two times a day (BID) | ORAL | 0 refills | Status: DC

## 2019-12-18 NOTE — Addendum Note (Signed)
Addended by: Currie Paris on: 12/18/2019 08:44 PM   Modules accepted: Orders

## 2019-12-19 ENCOUNTER — Encounter: Payer: Medicaid Other | Admitting: Certified Nurse Midwife

## 2019-12-26 ENCOUNTER — Ambulatory Visit (INDEPENDENT_AMBULATORY_CARE_PROVIDER_SITE_OTHER): Payer: Medicaid Other | Admitting: Certified Nurse Midwife

## 2019-12-26 ENCOUNTER — Other Ambulatory Visit (HOSPITAL_COMMUNITY)
Admission: RE | Admit: 2019-12-26 | Discharge: 2019-12-26 | Disposition: A | Discharge status: Home / Self Care | Payer: Medicaid Other | Source: Ambulatory Visit | Attending: Certified Nurse Midwife | Admitting: Certified Nurse Midwife

## 2019-12-26 ENCOUNTER — Other Ambulatory Visit: Payer: Self-pay

## 2019-12-26 VITALS — BP 95/72 | HR 87 | Temp 97.8°F | Wt 160.0 lb

## 2019-12-26 DIAGNOSIS — Z3403 Encounter for supervision of normal first pregnancy, third trimester: Secondary | ICD-10-CM | POA: Diagnosis not present

## 2019-12-26 DIAGNOSIS — Z3A36 36 weeks gestation of pregnancy: Secondary | ICD-10-CM

## 2019-12-26 DIAGNOSIS — R8271 Bacteriuria: Secondary | ICD-10-CM

## 2019-12-26 NOTE — Patient Instructions (Signed)
Fetal Movement Counts Patient Name: ________________________________________________ Patient Due Date: ____________________ What is a fetal movement count?  A fetal movement count is the number of times that you feel your baby move during a certain amount of time. This may also be called a fetal kick count. A fetal movement count is recommended for every pregnant woman. You may be asked to start counting fetal movements as early as week 28 of your pregnancy. Pay attention to when your baby is most active. You may notice your baby's sleep and wake cycles. You may also notice things that make your baby move more. You should do a fetal movement count:  When your baby is normally most active.  At the same time each day. A good time to count movements is while you are resting, after having something to eat and drink. How do I count fetal movements? 1. Find a quiet, comfortable area. Sit, or lie down on your side. 2. Write down the date, the start time and stop time, and the number of movements that you felt between those two times. Take this information with you to your health care visits. 3. Write down your start time when you feel the first movement. 4. Count kicks, flutters, swishes, rolls, and jabs. You should feel at least 10 movements. 5. You may stop counting after you have felt 10 movements, or if you have been counting for 2 hours. Write down the stop time. 6. If you do not feel 10 movements in 2 hours, contact your health care provider for further instructions. Your health care provider may want to do additional tests to assess your baby's well-being. Contact a health care provider if:  You feel fewer than 10 movements in 2 hours.  Your baby is not moving like he or she usually does. Date: ____________ Start time: ____________ Stop time: ____________ Movements: ____________ Date: ____________ Start time: ____________ Stop time: ____________ Movements: ____________ Date: ____________  Start time: ____________ Stop time: ____________ Movements: ____________ Date: ____________ Start time: ____________ Stop time: ____________ Movements: ____________ Date: ____________ Start time: ____________ Stop time: ____________ Movements: ____________ Date: ____________ Start time: ____________ Stop time: ____________ Movements: ____________ Date: ____________ Start time: ____________ Stop time: ____________ Movements: ____________ Date: ____________ Start time: ____________ Stop time: ____________ Movements: ____________ Date: ____________ Start time: ____________ Stop time: ____________ Movements: ____________ This information is not intended to replace advice given to you by your health care provider. Make sure you discuss any questions you have with your health care provider. Document Revised: 06/05/2019 Document Reviewed: 06/05/2019 Elsevier Patient Education  2020 Elsevier Inc. Braxton Hicks Contractions Contractions of the uterus can occur throughout pregnancy, but they are not always a sign that you are in labor. You may have practice contractions called Braxton Hicks contractions. These false labor contractions are sometimes confused with true labor. What are Braxton Hicks contractions? Braxton Hicks contractions are tightening movements that occur in the muscles of the uterus before labor. Unlike true labor contractions, these contractions do not result in opening (dilation) and thinning of the cervix. Toward the end of pregnancy (32-34 weeks), Braxton Hicks contractions can happen more often and may become stronger. These contractions are sometimes difficult to tell apart from true labor because they can be very uncomfortable. You should not feel embarrassed if you go to the hospital with false labor. Sometimes, the only way to tell if you are in true labor is for your health care provider to look for changes in the cervix. The health care provider   will do a physical exam and may  monitor your contractions. If you are not in true labor, the exam should show that your cervix is not dilating and your water has not broken. If there are no other health problems associated with your pregnancy, it is completely safe for you to be sent home with false labor. You may continue to have Braxton Hicks contractions until you go into true labor. How to tell the difference between true labor and false labor True labor  Contractions last 30-70 seconds.  Contractions become very regular.  Discomfort is usually felt in the top of the uterus, and it spreads to the lower abdomen and low back.  Contractions do not go away with walking.  Contractions usually become more intense and increase in frequency.  The cervix dilates and gets thinner. False labor  Contractions are usually shorter and not as strong as true labor contractions.  Contractions are usually irregular.  Contractions are often felt in the front of the lower abdomen and in the groin.  Contractions may go away when you walk around or change positions while lying down.  Contractions get weaker and are shorter-lasting as time goes on.  The cervix usually does not dilate or become thin. Follow these instructions at home:   Take over-the-counter and prescription medicines only as told by your health care provider.  Keep up with your usual exercises and follow other instructions from your health care provider.  Eat and drink lightly if you think you are going into labor.  If Braxton Hicks contractions are making you uncomfortable: ? Change your position from lying down or resting to walking, or change from walking to resting. ? Sit and rest in a tub of warm water. ? Drink enough fluid to keep your urine pale yellow. Dehydration may cause these contractions. ? Do slow and deep breathing several times an hour.  Keep all follow-up prenatal visits as told by your health care provider. This is important. Contact a  health care provider if:  You have a fever.  You have continuous pain in your abdomen. Get help right away if:  Your contractions become stronger, more regular, and closer together.  You have fluid leaking or gushing from your vagina.  You pass blood-tinged mucus (bloody show).  You have bleeding from your vagina.  You have low back pain that you never had before.  You feel your baby's head pushing down and causing pelvic pressure.  Your baby is not moving inside you as much as it used to. Summary  Contractions that occur before labor are called Braxton Hicks contractions, false labor, or practice contractions.  Braxton Hicks contractions are usually shorter, weaker, farther apart, and less regular than true labor contractions. True labor contractions usually become progressively stronger and regular, and they become more frequent.  Manage discomfort from Braxton Hicks contractions by changing position, resting in a warm bath, drinking plenty of water, or practicing deep breathing. This information is not intended to replace advice given to you by your health care provider. Make sure you discuss any questions you have with your health care provider. Document Revised: 09/28/2017 Document Reviewed: 03/01/2017 Elsevier Patient Education  2020 Elsevier Inc.  

## 2019-12-26 NOTE — Progress Notes (Signed)
Subjective:  Colleen Wilcox is a 29 y.o. G1P0 at [redacted]w[redacted]d being seen today for ongoing prenatal care.  She is currently monitored for the following issues for this low-risk pregnancy and has Supervision of normal first pregnancy; Chlamydia trachomatis infection during pregnancy in second trimester; and GBS bacteriuria on their problem list.  Patient reports no complaints.  Contractions: Not present. Vag. Bleeding: None.  Movement: Present. Denies leaking of fluid.   The following portions of the patient's history were reviewed and updated as appropriate: allergies, current medications, past family history, past medical history, past social history, past surgical history and problem list. Problem list updated.  Objective:   Vitals:   12/26/19 0903  BP: 95/72  Pulse: 87  Temp: 97.8 F (36.6 C)  Weight: 160 lb (72.6 kg)    Fetal Status: Fetal Heart Rate (bpm): 134 Fundal Height: 38 cm Movement: Present     General:  Alert, oriented and cooperative. Patient is in no acute distress.  Skin: Skin is warm and dry. No rash noted.   Cardiovascular: Normal heart rate noted  Respiratory: Normal respiratory effort, no problems with respiration noted  Abdomen: Soft, gravid, appropriate for gestational age. Pain/Pressure: Absent     Pelvic: Vag. Bleeding: None Vag D/C Character: Thin   Cervical exam deferred        Extremities: Normal range of motion.  Edema: None  Mental Status: Normal mood and affect. Normal behavior. Normal judgment and thought content.   Urinalysis:      Assessment and Plan:  Pregnancy: G1P0 at [redacted]w[redacted]d  1. Encounter for supervision of normal first pregnancy in third trimester - Cervicovaginal ancillary only( [place])  2. GBS bacteriuria - treat in labor  Preterm labor symptoms and general obstetric precautions including but not limited to vaginal bleeding, contractions, leaking of fluid and fetal movement were reviewed in detail with the patient. Please refer to After  Visit Summary for other counseling recommendations.  Return in about 2 weeks (around 01/09/2020).   Donette Larry, CNM

## 2019-12-29 LAB — CERVICOVAGINAL ANCILLARY ONLY
Chlamydia: NEGATIVE
Comment: NEGATIVE
Comment: NORMAL
Neisseria Gonorrhea: NEGATIVE

## 2020-01-09 ENCOUNTER — Telehealth (INDEPENDENT_AMBULATORY_CARE_PROVIDER_SITE_OTHER): Payer: Medicaid Other | Admitting: Certified Nurse Midwife

## 2020-01-09 VITALS — BP 114/67

## 2020-01-09 DIAGNOSIS — O23593 Infection of other part of genital tract in pregnancy, third trimester: Secondary | ICD-10-CM

## 2020-01-09 DIAGNOSIS — A5901 Trichomonal vulvovaginitis: Secondary | ICD-10-CM

## 2020-01-09 DIAGNOSIS — R8271 Bacteriuria: Secondary | ICD-10-CM

## 2020-01-09 DIAGNOSIS — A568 Sexually transmitted chlamydial infection of other sites: Secondary | ICD-10-CM

## 2020-01-09 DIAGNOSIS — Z3A38 38 weeks gestation of pregnancy: Secondary | ICD-10-CM

## 2020-01-09 DIAGNOSIS — O98313 Other infections with a predominantly sexual mode of transmission complicating pregnancy, third trimester: Secondary | ICD-10-CM

## 2020-01-09 DIAGNOSIS — O98312 Other infections with a predominantly sexual mode of transmission complicating pregnancy, second trimester: Secondary | ICD-10-CM

## 2020-01-09 DIAGNOSIS — Z3402 Encounter for supervision of normal first pregnancy, second trimester: Secondary | ICD-10-CM

## 2020-01-09 HISTORY — DX: Trichomonal vulvovaginitis: A59.01

## 2020-01-09 NOTE — Progress Notes (Signed)
   TELEHEALTH OBSTETRICS PRENATAL VIRTUAL VIDEO VISIT ENCOUNTER NOTE  Provider location: [facility] [place]   I connected with Amador Cunas on 01/09/20 at  9:50 AM EST by MyChart Video Encounter at home and verified that I am speaking with the correct person using two identifiers.   I discussed the limitations, risks, security and privacy concerns of performing an evaluation and management service virtually and the availability of in person appointments. I also discussed with the patient that there may be a patient responsible charge related to this service. The patient expressed understanding and agreed to proceed. Subjective:  Colleen Wilcox is a 29 y.o. G1P0 at [redacted]w[redacted]d being seen today for ongoing prenatal care.  She is currently monitored for the following issues for this low-risk pregnancy and has Supervision of normal first pregnancy; Chlamydia trachomatis infection during pregnancy in second trimester; and GBS bacteriuria on their problem list.  Patient reports no complaints.  Contractions: Not present. Vag. Bleeding: None.  Movement: Present. Denies any leaking of fluid.   The following portions of the patient's history were reviewed and updated as appropriate: allergies, current medications, past family history, past medical history, past social history, past surgical history and problem list.   Objective:   Vitals:   01/09/20 0953  BP: 114/67    Fetal Status:     Movement: Present     General:  Alert, oriented and cooperative. Patient is in no acute distress.  Respiratory: Normal respiratory effort, no problems with respiration noted  Mental Status: Normal mood and affect. Normal behavior. Normal judgment and thought content.  Rest of physical exam deferred due to type of encounter  Imaging: No results found.  Assessment and Plan:  Pregnancy: G1P0 at [redacted]w[redacted]d  1. Encounter for supervision of normal first pregnancy in third trimester  2. Trichomonal  vaginitis during pregnancy in third trimester - she and partner were treated, needs TOC next visit  Term labor symptoms and general obstetric precautions including but not limited to vaginal bleeding, contractions, leaking of fluid and fetal movement were reviewed in detail with the patient. I discussed the assessment and treatment plan with the patient. The patient was provided an opportunity to ask questions and all were answered. The patient agreed with the plan and demonstrated an understanding of the instructions. The patient was advised to call back or seek an in-person office evaluation/go to MAU at [facility] for any urgent or concerning symptoms. Please refer to After Visit Summary for other counseling recommendations.   I provided 6 minutes of face-to-face time during this encounter.  Return in about 1 week (around 01/16/2020).  Future Appointments  Date Time Provider Department Center  01/19/2020 11:00 AM Penne Lash, Fredrich Romans, MD [facility]    Donette Larry, CNM [facility] [facility], [facility]

## 2020-01-19 ENCOUNTER — Ambulatory Visit (INDEPENDENT_AMBULATORY_CARE_PROVIDER_SITE_OTHER): Payer: Medicaid Other | Admitting: Obstetrics & Gynecology

## 2020-01-19 ENCOUNTER — Other Ambulatory Visit (HOSPITAL_COMMUNITY)
Admission: RE | Admit: 2020-01-19 | Discharge: 2020-01-19 | Disposition: A | Discharge status: Home / Self Care | Payer: Medicaid Other | Source: Ambulatory Visit | Attending: Obstetrics & Gynecology | Admitting: Obstetrics & Gynecology

## 2020-01-19 ENCOUNTER — Other Ambulatory Visit: Payer: Self-pay

## 2020-01-19 VITALS — BP 111/77 | HR 78 | Wt 166.0 lb

## 2020-01-19 DIAGNOSIS — Z3403 Encounter for supervision of normal first pregnancy, third trimester: Secondary | ICD-10-CM

## 2020-01-19 DIAGNOSIS — A5901 Trichomonal vulvovaginitis: Secondary | ICD-10-CM | POA: Insufficient documentation

## 2020-01-19 DIAGNOSIS — O23593 Infection of other part of genital tract in pregnancy, third trimester: Secondary | ICD-10-CM | POA: Diagnosis not present

## 2020-01-19 DIAGNOSIS — Z3A00 Weeks of gestation of pregnancy not specified: Secondary | ICD-10-CM | POA: Diagnosis not present

## 2020-01-19 DIAGNOSIS — Z3A39 39 weeks gestation of pregnancy: Secondary | ICD-10-CM

## 2020-01-19 NOTE — Progress Notes (Signed)
   PRENATAL VISIT NOTE  Subjective:  Colleen Wilcox is a 29 y.o. G1P0 at [redacted]w[redacted]d being seen today for ongoing prenatal care.  She is currently monitored for the following issues for this low-risk pregnancy and has Supervision of normal first pregnancy; Chlamydia trachomatis infection during pregnancy in second trimester; GBS bacteriuria; and Trichomonal vaginitis during pregnancy on their problem list.  Patient reports no complaints.  Contractions: Irritability. Vag. Bleeding: None.  Movement: Present. Denies leaking of fluid.   The following portions of the patient's history were reviewed and updated as appropriate: allergies, current medications, past family history, past medical history, past social history, past surgical history and problem list.   Objective:   Vitals:   01/19/20 1115  BP: 111/77  Pulse: 78  Weight: 166 lb (75.3 kg)    Fetal Status: Fetal Heart Rate (bpm): 144 Fundal Height: 41 cm Movement: Present  Presentation: Vertex  General:  Alert, oriented and cooperative. Patient is in no acute distress.  Skin: Skin is warm and dry. No rash noted.   Cardiovascular: Normal heart rate noted  Respiratory: Normal respiratory effort, no problems with respiration noted  Abdomen: Soft, gravid, appropriate for gestational age.  Pain/Pressure: Present     Pelvic: Cervical exam performed in the presence of a chaperone Dilation: 2 Effacement (%): 60 Station: -3; membranes stripped  Extremities: Normal range of motion.  Edema: None  Mental Status: Normal mood and affect. Normal behavior. Normal judgment and thought content.   Assessment and Plan:  Pregnancy: G1P0 at [redacted]w[redacted]d 1. Trichomonal vaginitis during pregnancy in third trimester TOC today  2. Encounter for supervision of normal first pregnancy in third trimester Pt would like to have a Doula.  I am finding out the name and phone number for her Doula to call to make sure she has bad immunizations and check off.  We will send this  information to her through my chart.  If no delivery in one week, will set induction and will need an NST that visit.    Term labor symptoms and general obstetric precautions including but not limited to vaginal bleeding, contractions, leaking of fluid and fetal movement were reviewed in detail with the patient. Please refer to After Visit Summary for other counseling recommendations.   No follow-ups on file.  Future Appointments  Date Time Provider Department Center  01/26/2020 11:15 AM Allie Bossier, MD [facility]    Elsie Lincoln, MD

## 2020-01-20 LAB — CERVICOVAGINAL ANCILLARY ONLY
Comment: NEGATIVE
Trichomonas: NEGATIVE

## 2020-01-23 ENCOUNTER — Inpatient Hospital Stay (HOSPITAL_COMMUNITY)
Admission: AD | Admit: 2020-01-23 | Discharge: 2020-01-25 | DRG: 807 | Disposition: A | Discharge status: Home / Self Care | Payer: Medicaid Other | Attending: Obstetrics & Gynecology | Admitting: Obstetrics & Gynecology

## 2020-01-23 ENCOUNTER — Other Ambulatory Visit: Payer: Self-pay

## 2020-01-23 ENCOUNTER — Encounter (HOSPITAL_COMMUNITY): Payer: Self-pay | Admitting: Obstetrics & Gynecology

## 2020-01-23 DIAGNOSIS — D509 Iron deficiency anemia, unspecified: Secondary | ICD-10-CM

## 2020-01-23 DIAGNOSIS — A5901 Trichomonal vulvovaginitis: Secondary | ICD-10-CM | POA: Diagnosis present

## 2020-01-23 DIAGNOSIS — O99824 Streptococcus B carrier state complicating childbirth: Principal | ICD-10-CM | POA: Diagnosis present

## 2020-01-23 DIAGNOSIS — O98813 Other maternal infectious and parasitic diseases complicating pregnancy, third trimester: Secondary | ICD-10-CM | POA: Diagnosis not present

## 2020-01-23 DIAGNOSIS — Z3A40 40 weeks gestation of pregnancy: Secondary | ICD-10-CM | POA: Diagnosis not present

## 2020-01-23 DIAGNOSIS — O23593 Infection of other part of genital tract in pregnancy, third trimester: Secondary | ICD-10-CM

## 2020-01-23 DIAGNOSIS — Z20822 Contact with and (suspected) exposure to covid-19: Secondary | ICD-10-CM | POA: Diagnosis present

## 2020-01-23 DIAGNOSIS — Z3403 Encounter for supervision of normal first pregnancy, third trimester: Secondary | ICD-10-CM

## 2020-01-23 DIAGNOSIS — R8271 Bacteriuria: Secondary | ICD-10-CM | POA: Diagnosis present

## 2020-01-23 DIAGNOSIS — O480 Post-term pregnancy: Secondary | ICD-10-CM | POA: Diagnosis not present

## 2020-01-23 DIAGNOSIS — O99019 Anemia complicating pregnancy, unspecified trimester: Secondary | ICD-10-CM

## 2020-01-23 DIAGNOSIS — O26893 Other specified pregnancy related conditions, third trimester: Secondary | ICD-10-CM | POA: Diagnosis present

## 2020-01-23 DIAGNOSIS — A568 Sexually transmitted chlamydial infection of other sites: Secondary | ICD-10-CM

## 2020-01-23 LAB — CBC
HCT: 41.1 % (ref 36.0–46.0)
Hemoglobin: 13.2 g/dL (ref 12.0–15.0)
MCH: 26.7 pg (ref 26.0–34.0)
MCHC: 32.1 g/dL (ref 30.0–36.0)
MCV: 83 fL (ref 80.0–100.0)
Platelets: 144 10*3/uL — ABNORMAL LOW (ref 150–400)
RBC: 4.95 MIL/uL (ref 3.87–5.11)
RDW: 15.8 % — ABNORMAL HIGH (ref 11.5–15.5)
WBC: 11.7 10*3/uL — ABNORMAL HIGH (ref 4.0–10.5)
nRBC: 0 % (ref 0.0–0.2)

## 2020-01-23 LAB — RESPIRATORY PANEL BY RT PCR (FLU A&B, COVID)
Influenza A by PCR: NEGATIVE
Influenza B by PCR: NEGATIVE
SARS Coronavirus 2 by RT PCR: NEGATIVE

## 2020-01-23 LAB — RPR: RPR Ser Ql: NONREACTIVE

## 2020-01-23 LAB — ABO/RH: ABO/RH(D): B POS

## 2020-01-23 LAB — TYPE AND SCREEN
ABO/RH(D): B POS
Antibody Screen: NEGATIVE

## 2020-01-23 MED ORDER — DIBUCAINE (PERIANAL) 1 % EX OINT: 1.0000 "application " | TOPICAL_OINTMENT | CUTANEOUS | Status: DC | PRN

## 2020-01-23 MED ORDER — SODIUM CHLORIDE 0.9 % IV SOLN
2.0000 g | Freq: Once | INTRAVENOUS | Status: AC
  Administered 2020-01-23: 2 g via INTRAVENOUS
  Filled 2020-01-23: qty 2000

## 2020-01-23 MED ORDER — OXYTOCIN 40 UNITS IN NORMAL SALINE INFUSION - SIMPLE MED
INTRAVENOUS | Status: AC
  Filled 2020-01-23: qty 1000

## 2020-01-23 MED ORDER — SENNOSIDES-DOCUSATE SODIUM 8.6-50 MG PO TABS
2.0000 | ORAL_TABLET | ORAL | Status: DC
  Administered 2020-01-23 – 2020-01-24 (×2): 2 via ORAL
  Filled 2020-01-23 (×2): qty 2

## 2020-01-23 MED ORDER — OXYTOCIN 10 UNIT/ML IJ SOLN
INTRAMUSCULAR | Status: AC
  Filled 2020-01-23: qty 1

## 2020-01-23 MED ORDER — OXYTOCIN 40 UNITS IN NORMAL SALINE INFUSION - SIMPLE MED
2.5000 [IU]/h | INTRAVENOUS | Status: DC
  Administered 2020-01-23: 2.5 [IU]/h via INTRAVENOUS

## 2020-01-23 MED ORDER — LACTATED RINGERS IV SOLN: INTRAVENOUS | Status: DC

## 2020-01-23 MED ORDER — OXYTOCIN BOLUS FROM INFUSION
500.0000 mL | Freq: Once | INTRAVENOUS | Status: AC
  Administered 2020-01-23: 500 mL via INTRAVENOUS

## 2020-01-23 MED ORDER — PRENATAL MULTIVITAMIN CH
1.0000 | ORAL_TABLET | Freq: Every day | ORAL | Status: DC
  Administered 2020-01-23 – 2020-01-25 (×3): 1 via ORAL
  Filled 2020-01-23 (×3): qty 1

## 2020-01-23 MED ORDER — COCONUT OIL OIL
1.0000 "application " | TOPICAL_OIL | Status: DC | PRN
  Administered 2020-01-25: 1 via TOPICAL

## 2020-01-23 MED ORDER — SIMETHICONE 80 MG PO CHEW: 80.0000 mg | CHEWABLE_TABLET | ORAL | Status: DC | PRN

## 2020-01-23 MED ORDER — ACETAMINOPHEN 325 MG PO TABS: 650.0000 mg | ORAL_TABLET | ORAL | Status: DC | PRN

## 2020-01-23 MED ORDER — FENTANYL CITRATE (PF) 100 MCG/2ML IJ SOLN
INTRAMUSCULAR | Status: AC
  Administered 2020-01-23: 100 ug
  Filled 2020-01-23: qty 2

## 2020-01-23 MED ORDER — ONDANSETRON HCL 4 MG PO TABS: 4.0000 mg | ORAL_TABLET | ORAL | Status: DC | PRN

## 2020-01-23 MED ORDER — LIDOCAINE HCL (PF) 1 % IJ SOLN
30.0000 mL | INTRAMUSCULAR | Status: DC | PRN
  Filled 2020-01-23: qty 30

## 2020-01-23 MED ORDER — LACTATED RINGERS IV SOLN: 500.0000 mL | INTRAVENOUS | Status: DC | PRN

## 2020-01-23 MED ORDER — DIPHENHYDRAMINE HCL 25 MG PO CAPS: 25.0000 mg | ORAL_CAPSULE | Freq: Four times a day (QID) | ORAL | Status: DC | PRN

## 2020-01-23 MED ORDER — IBUPROFEN 600 MG PO TABS
600.0000 mg | ORAL_TABLET | Freq: Three times a day (TID) | ORAL | Status: DC | PRN
  Administered 2020-01-23 – 2020-01-25 (×5): 600 mg via ORAL
  Filled 2020-01-23 (×5): qty 1

## 2020-01-23 MED ORDER — BENZOCAINE-MENTHOL 20-0.5 % EX AERO
1.0000 "application " | INHALATION_SPRAY | CUTANEOUS | Status: DC | PRN
  Administered 2020-01-23: 1 via TOPICAL
  Filled 2020-01-23: qty 56

## 2020-01-23 MED ORDER — ONDANSETRON HCL 4 MG/2ML IJ SOLN
4.0000 mg | Freq: Four times a day (QID) | INTRAMUSCULAR | Status: DC | PRN
  Administered 2020-01-23: 4 mg via INTRAVENOUS
  Filled 2020-01-23: qty 2

## 2020-01-23 MED ORDER — TETANUS-DIPHTH-ACELL PERTUSSIS 5-2.5-18.5 LF-MCG/0.5 IM SUSP: 0.5000 mL | Freq: Once | INTRAMUSCULAR | Status: DC

## 2020-01-23 MED ORDER — WITCH HAZEL-GLYCERIN EX PADS: 1.0000 "application " | MEDICATED_PAD | CUTANEOUS | Status: DC | PRN

## 2020-01-23 MED ORDER — ACETAMINOPHEN 325 MG PO TABS
650.0000 mg | ORAL_TABLET | Freq: Four times a day (QID) | ORAL | Status: DC | PRN
  Administered 2020-01-23 – 2020-01-25 (×6): 650 mg via ORAL
  Filled 2020-01-23 (×7): qty 2

## 2020-01-23 MED ORDER — SOD CITRATE-CITRIC ACID 500-334 MG/5ML PO SOLN: 30.0000 mL | ORAL | Status: DC | PRN

## 2020-01-23 MED ORDER — ONDANSETRON HCL 4 MG/2ML IJ SOLN: 4.0000 mg | INTRAMUSCULAR | Status: DC | PRN

## 2020-01-23 MED ORDER — MEASLES, MUMPS & RUBELLA VAC IJ SOLR: 0.5000 mL | Freq: Once | INTRAMUSCULAR | Status: DC

## 2020-01-23 MED ORDER — FENTANYL CITRATE (PF) 100 MCG/2ML IJ SOLN
100.0000 ug | Freq: Once | INTRAMUSCULAR | Status: AC
  Administered 2020-01-23: 100 ug via INTRAVENOUS

## 2020-01-23 NOTE — H&P (Addendum)
OBSTETRIC ADMISSION HISTORY AND PHYSICAL  Karrah Mangini is a 29 y.o. female G1P0 with IUP at 23w0dby LMP presenting for SOL. She reports +FMs, No LOF, no VB, no blurry vision, headaches or peripheral edema, and RUQ pain.  She plans on breast feeding. She requests condoms for birth control. [facility]  Dating: By LMP --->  Estimated Date of Delivery: 01/23/20  Sono:    _0 , CWD, normal anatomy, cephalic presentation, anterior placenta, 292g, 37% EFW   Prenatal History/Complications: -GBS positive -Supervision of normal first pregnancy -Chlamydia Trachomatis Infection in second trimester (treated, negative 4 wks ago) -Trichomonal vaginitis during pregnancy (treated)  Past Medical History: History reviewed. No pertinent past medical history.  Past Surgical History: Past Surgical History:  Procedure Laterality Date  . MOUTH SURGERY      Obstetrical History: OB History    Gravida  1   Para      Term      Preterm      AB      Living        SAB      TAB      Ectopic      Multiple      Live Births              Social History Social History   Socioeconomic History  . Marital status: Single    Spouse name: Not on file  . Number of children: Not on file  . Years of education: Not on file  . Highest education level: Not on file  Occupational History  . Occupation: SShip broker Tobacco Use  . Smoking status: Never Smoker  . Smokeless tobacco: Never Used  Substance and Sexual Activity  . Alcohol use: Not Currently  . Drug use: Never  . Sexual activity: Yes    Birth control/protection: None  Other Topics Concern  . Not on file  Social History Narrative  . Not on file   Social Determinants of Health   Financial Resource Strain:   . Difficulty of Paying Living Expenses:   Food Insecurity:   . Worried About RCharity fundraiserin the Last Year:   . RArboriculturistin the Last Year:   Transportation Needs:   . LConsulting civil engineer(Medical):   .Marland KitchenLack of Transportation (Non-Medical):   Physical Activity:   . Days of Exercise per Week:   . Minutes of Exercise per Session:   Stress:   . Feeling of Stress :   Social Connections:   . Frequency of Communication with Friends and Family:   . Frequency of Social Gatherings with Friends and Family:   . Attends Religious Services:   . Active Member of Clubs or Organizations:   . Attends CArchivistMeetings:   .Marland KitchenMarital Status:     Family History: Family History  Problem Relation Age of Onset  . Head & neck cancer Mother     Allergies: No Known Allergies  Medications Prior to Admission  Medication Sig Dispense Refill Last Dose  . ferrous sulfate (FERROUSUL) 325 (65 FE) MG tablet Take 1 tablet (325 mg total) by mouth 2 (two) times daily. 60 tablet 2 01/22/2020 at Unknown time  . Prenatal Vit-Fe Fumarate-FA (PRENATAL VITAMINS PO) Take by mouth.   01/22/2020 at Unknown time  . Blood Pressure Monitoring (BLOOD PRESSURE KIT) DEVI 1 Device by Does not apply route once a week. 1 Device 0  Review of Systems   All systems reviewed and negative except as stated in HPI  Blood pressure 119/74, temperature 97.9 F (36.6 C), temperature source Oral, resp. rate 18, height _0  (1.778 m), weight 74.4 kg, last menstrual period 04/18/2019. General appearance: alert, cooperative and no distress Lungs: clear to auscultation bilaterally Heart: regular rate and rhythm Abdomen: soft, non-tender; bowel sounds normal Extremities: Homans sign is negative, no sign of DVT Presentation: cephalic by exam Fetal monitoringBaseline: 135 bpm, Variability: Good {> 6 bpm) and Accelerations: Reactive Uterine activityFrequency: Every 2-3 minutes Dilation: 9 Effacement (%): 90 Station: -2 Exam by:: Dr. Marice Potter   Prenatal labs: ABO, Rh: --/--/PENDING (03/26 0350) Antibody: PENDING (03/26 0350) Rubella: 2.15 (09/29 1000) RPR: NON-REACTIVE (12/22 0920)  HBsAg:  NON-REACTIVE (09/29 1000)  HIV: NON-REACTIVE (12/22 0920)  GBS:   Positive 2 hr GTT normal Genetic screening  NIPS & AFP normal Anatomy US normal  Prenatal Transfer Tool  Maternal Diabetes: No Genetic Screening: Normal Maternal Ultrasounds/Referrals: Normal Fetal Ultrasounds or other Referrals:  None Maternal Substance Abuse:  No Significant Maternal Medications:  None Significant Maternal Lab Results: Group B Strep positive  Results for orders placed or performed during the hospital encounter of 01/23/20 (from the past 24 hour(s))  Respiratory Panel by RT PCR (Flu A&B, Covid) - Nasopharyngeal Swab   Collection Time: 01/23/20  3:35 AM   Specimen: Nasopharyngeal Swab  Result Value Ref Range   SARS Coronavirus 2 by RT PCR NEGATIVE NEGATIVE   Influenza A by PCR NEGATIVE NEGATIVE   Influenza B by PCR NEGATIVE NEGATIVE  Type and screen [place]   Collection Time: 01/23/20  3:50 AM  Result Value Ref Range   ABO/RH(D) PENDING    Antibody Screen PENDING    Sample Expiration      01/26/2020,2359 Performed at [facility], [place] [address], [place], [place] [address]     Patient Active Problem List   Diagnosis Date Noted  . Trichomonal vaginitis during pregnancy 01/09/2020  . GBS bacteriuria 08/03/2019  . Chlamydia trachomatis infection during pregnancy in second trimester 07/30/2019  . Supervision of normal first pregnancy 07/29/2019    Assessment/Plan:  Salam Chesterfield is a 29 y.o. G1P0 at 51w0dhere for SOL.  #Labor: Expectant management  #Pain: Per patient #FWB: Cat 1 #ID:  GBS positive. Give ampicillin  #MOF: Breast #MOC:Condoms #Circ:  N/a; girl   PJanne Lab Medical Student  01/23/2020, 4:30 AM  I saw and evaluated the patient. I agree with the findings and the plan of care as documented in the student's note. Vertex by exam. Presented at 9/90/-2 with bulging bag. In attempt to achieve adequate GBS ppx, will not augment unless  fetal/maternal indication. Okay to do intermittent monitoring while not on medications. EFW 3300g. Anticipate SVD. Hx of Trich and Chlamydia this pregnancy both with negative TOC.   CBarrington Ellison MD [facility] Center[facility] Medicine Fellow, [facility] WDe[employer] CGarceno

## 2020-01-23 NOTE — Discharge Summary (Signed)
Postpartum Discharge Summary     Patient Name: Colleen Wilcox DOB: January 08, 1991 MRN: [id number]  Date of admission: 01/23/2020 Delivering Provider: Chauncey Mann   Date of discharge: 01/25/2020  Admitting diagnosis: Labor and delivery, indication for care [O75.9] Intrauterine pregnancy: [redacted]w[redacted]d    Secondary diagnosis:  Active Problems:   Chlamydia trachomatis infection during pregnancy in second trimester   GBS bacteriuria   Trichomonal vaginitis during pregnancy   [redacted] weeks gestation of pregnancy  Additional problems: None     Discharge diagnosis: Term Pregnancy Delivered                                                                                                Post partum procedures:None  Augmentation: None  Complications: None  Hospital course:  Onset of Labor With Vaginal Delivery     29y.o. yo G1P0 at 441w0das admitted in Active Labor on 01/23/2020. Patient had an uncomplicated labor course as follows: Patient presented to L&D for SOL. Initial SVE: 9/90/-2 with bulging bag. SROM occurred. She then progressed to complete.   Membrane Rupture Time/Date: 4:44 AM ,01/23/2020   Intrapartum Procedures: Episiotomy: None [1]                                         Lacerations:  2nd degree [3]  Patient had a delivery of a Viable infant. 01/23/2020  Information for the patient's newborn:  AsMelynda, Krzywickiirl SoZeniya0[[id number]]Delivery Method: Vaginal, Spontaneous(Filed from Delivery Summary)     Pateint had an uncomplicated postpartum course. EBL 980 mL at delivery. PP Hgb 10.9 and patient started on PO iron.  She is ambulating, tolerating a regular diet, passing flatus, and urinating well. Patient is discharged home in stable condition on 01/25/20.  Delivery time: 5:12 AM    Magnesium Sulfate received: No BMZ received: No Rhophylac:No MMR:No Transfusion:No  Physical exam  Vitals:   01/24/20 0441 01/24/20 1312 01/24/20 2245 01/25/20 0500  BP: 106/75 99/66 (!) 92/58 103/67   Pulse: 67 63 (!) 59 60  Resp: '18 16 16 16  ' Temp: 97.7 F (36.5 C) 98.2 F (36.8 C) 98.5 F (36.9 C) 98.1 F (36.7 C)  TempSrc: Oral Oral Oral Oral  SpO2: 100%   100%  Weight:      Height:       General: alert, cooperative and no distress Lochia: appropriate Uterine Fundus: firm Incision: N/A DVT Evaluation: No evidence of DVT seen on physical exam. Labs: Lab Results  Component Value Date   WBC 13.0 (H) 01/24/2020   HGB 10.9 (L) 01/24/2020   HCT 33.2 (L) 01/24/2020   MCV 83.2 01/24/2020   PLT 130 (L) 01/24/2020   No flowsheet data found. Edinburgh Score: Edinburgh Postnatal Depression Scale Screening Tool 01/24/2020  I have been able to laugh and see the funny side of things. 0  I have looked forward with enjoyment to things. 0  I have blamed myself unnecessarily when things went wrong. 0  I have been  anxious or worried for no good reason. 0  I have felt scared or panicky for no good reason. 0  Things have been getting on top of me. 0  I have been so unhappy that I have had difficulty sleeping. 0  I have felt sad or miserable. 0  I have been so unhappy that I have been crying. 0  The thought of harming myself has occurred to me. 0  Edinburgh Postnatal Depression Scale Total 0    Discharge instruction: per After Visit Summary and "Baby and Me Booklet".  After visit meds:  Allergies as of 01/25/2020   No Known Allergies     Medication List    TAKE these medications   acetaminophen 325 MG tablet Commonly known as: Tylenol Take 2 tablets (650 mg total) by mouth every 6 (six) hours as needed (for pain scale < 4).   Blood Pressure Kit Devi 1 Device by Does not apply route once a week.   ferrous sulfate 325 (65 FE) MG tablet Commonly known as: FerrouSul Take 1 tablet (325 mg total) by mouth every other day. What changed: when to take this   ibuprofen 600 MG tablet Commonly known as: ADVIL Take 1 tablet (600 mg total) by mouth every 8 (eight) hours as needed  for mild pain.   PRENATAL VITAMINS PO Take by mouth.   senna-docusate 8.6-50 MG tablet Commonly known as: Senokot-S Take 2 tablets by mouth daily. Start taking on: January 26, 2020       Diet: routine diet  Activity: Advance as tolerated. Pelvic rest for 6 weeks.   Outpatient follow up:4 weeks Follow up Appt: No future appointments. Follow up Visit:    Please schedule this patient for Postpartum visit in: 4 weeks with the following provider: Any provider Virtual For C/S patients schedule nurse incision check in weeks 2 weeks: no Low risk pregnancy complicated by: none Delivery mode:  SVD Anticipated Birth Control:  Condoms PP Procedures needed: none  Schedule Integrated BH visit: no     Newborn Data: Live born female  Birth Weight: 3549g APGAR: 26, 9  Newborn Delivery   Birth date/time: 01/23/2020 05:12:00 Delivery type: Vaginal, Spontaneous      Baby Feeding: Breast Disposition:home with mother   01/25/2020 Chauncey Mann, MD

## 2020-01-23 NOTE — Lactation Note (Addendum)
This note was copied from a baby's chart. Lactation Consultation Note  Patient Name: Colleen Wilcox [id number] Date: 01/23/2020  Baby Colleen Arianna in crib on arrival.  Mom reports she fed really well after delivery but they have been trying since and she will not feed much.Mom is a P1.  Baby Colleen now 29 hours old.  Mom reports she did not have any breastfeeding education.  Mom reports she will go back to work.  Mom reprots she is on [facility] [place].  Discussed obtaining a DEBP from [facility] for work. Infant sucking on hands on arrival.  Asked if I could assist with feeding. Asked mom if she knew how to hand express and she reported no.  Unable to get any drops with hand expression on right breast.  Did see a small glistening.   Mom agreed.infant started spitting as soon as went to give to mom.  Then she was fine. Handed infant to mom and she started latching infant in cradle hold and bringing nipple to her.  Infant unable to latch.  Assist in cross cradle hold and showed mom how to do it.  Showed proper alignment.  Mom with small short nipples and large somewhat pendulous breasts.  Mom reports comfort.    Reviewed [facility] breastfeeding [facility]ation [facility] handout and Understanding mother and baby Booklet..   Left mom breastfeeding.  Urged to call lactation as needed.   Maternal Data    Feeding    LATCH Score                   Interventions    Lactation Tools Discussed/Used     Consult Status      Arjan Strohm Michaelle Copas 01/23/2020, 8:36 PM

## 2020-01-23 NOTE — MAU Note (Signed)
PT SAYS UC STRONG SINCE 12 MN.  PNC WITH [place]  MW.  VE  2 CM.  DENIES HSV AND MRSA. GBS- POSITIVE.

## 2020-01-23 NOTE — Progress Notes (Signed)
Dr Morene Antu notified of pt's admission and status. Will admit to [facility]

## 2020-01-24 LAB — CBC
HCT: 33.2 % — ABNORMAL LOW (ref 36.0–46.0)
Hemoglobin: 10.9 g/dL — ABNORMAL LOW (ref 12.0–15.0)
MCH: 27.3 pg (ref 26.0–34.0)
MCHC: 32.8 g/dL (ref 30.0–36.0)
MCV: 83.2 fL (ref 80.0–100.0)
Platelets: 130 10*3/uL — ABNORMAL LOW (ref 150–400)
RBC: 3.99 MIL/uL (ref 3.87–5.11)
RDW: 15.5 % (ref 11.5–15.5)
WBC: 13 10*3/uL — ABNORMAL HIGH (ref 4.0–10.5)
nRBC: 0 % (ref 0.0–0.2)

## 2020-01-24 NOTE — Progress Notes (Addendum)
POSTPARTUM PROGRESS NOTE  Subjective: Amaria Mundorf is a 29 y.o. G1P1001 s/p VD at [redacted]w[redacted]d.  She reports she is doing well. No acute events overnight. She denies any problems with ambulating, voiding or po intake. Denies n/v, lightheadedness, and dizziness. Pain is well controlled.  Lochia is appropriate.  Objective: Blood pressure 106/75, pulse 67, temperature 97.7 F (36.5 C), temperature source Oral, resp. rate 18, height 5\' 10"  (1.778 m), weight 74.4 kg, last menstrual period 04/18/2019, SpO2 100 %, unknown if currently breastfeeding.  Physical Exam:  General: alert, cooperative and no distress Chest: no respiratory distress Abdomen: soft, non-tender  Uterine Fundus: firm, appropriately tender Extremities: No calf swelling or tenderness  no edema  Recent Labs    01/23/20 0350 01/24/20 0504  HGB 13.2 10.9*  HCT 41.1 33.2*    Assessment/Plan: Znya Oler is a 29 y.o. G1P1001 s/p VD at [redacted]w[redacted]d for SOL.  Routine Postpartum Care: Doing well, pain well-controlled.  -- Continue routine care, lactation support  -- Contraception: condoms -- Feeding: breast  Dispo: Plan for discharge tomorrow.   Princess [redacted]w[redacted]d, MSIII  I saw and evaluated the patient. I agree with the findings and the plan of care as documented in the student's note. EBL 980 mL at delivery but PP Hgb appropriate and patient asymptomatic. Inadequate GBS ppx; plan for DC tomorrow.   Eulis Manly, MD [facility] Family Medicine Fellow, [facility] for [facility]

## 2020-01-24 NOTE — Lactation Note (Signed)
This note was copied from a baby's chart. Lactation Consultation Note  Patient Name: Colleen Wilcox [id number] Date: 01/24/2020 Reason for consult: Follow-up assessment;Primapara;1st time breastfeeding;Term;Infant weight loss  Visited with mom of a 78 hours old FT female, she's a P1; baby is at 7% weight loss. Per mom baby has been cluster feeding, LC witnessed the last part of the feeding and assisted with repositioning, baby's arm was tucked inside in between baby and mom's belly and didn't allow for a deep latch.  Mom's nipples are short shafted but very compressible, she has already been set up with a hand pump and breast shells, some areolar edema still noted when revising hand expression. LC unable to obtain colostrum the first time assisting with hand expression, but after baby unlatched and mom was able to sit back and relax, she got a small drop, praised her for her efforts.  Per mom, last feeding was about 115 minutes, but it's been on and off, with baby taking "breaks" in between but per mom baby has been consistent with her feedings; 2 audible swallows noted on last feeding that LC witnessed; baby falling asleep by the time LC came in the room. Offered to set up a DEBP to provide extra breast stimulation and prevent any further weight loss for tomorrow prior discharge, mom agreed to start pumping tonight.  Reviewed instructions, cleaning, storage and breastmilk storage guidelines. Since mom is still working on getting her supply, LC also offered donor milk and mom agreed to start supplementation tonight. The instrument for supplementation will be a curved tip syringe but mom agreeable to try a slow flow nipple as a back up method. Donor milk form was left in the room for mom to sign, mom still holding baby and trying to BF again when exiting the room. NT Cat trying to get baby's hearing screening as well.  Reviewed normal newborn behavior, cluster, feeding, feeding cues, supplementation  guidelines and pumping schedule.  Feeding plan:  1. Encourage mom to keep putting baby to breast 8-12 times/24 hours or sooner if feeding cues are present 2. Mom will start pumping every 3 hours after feedings and will offer any drops she may get 3. Baby will start donor milk tonight, following supplementation guidelines when feeding baby at the breast, will start with 7-12 ml/feeding; mom aware amounts will increase at the 48 hours mark  Mom agreeable with feeding plan. No support person in mom's room at the time of [facility] consultation. Mom reported all questions and concerns were answered, she's aware of LC OP services and will call PRN.   Maternal Data    Feeding Feeding Type: Breast Fed  LATCH Score Latch: Repeated attempts needed to sustain latch, nipple held in mouth throughout feeding, stimulation needed to elicit sucking reflex.(baby getting sleepy)  Audible Swallowing: A few with stimulation  Type of Nipple: Everted at rest and after stimulation(short shafted)  Comfort (Breast/Nipple): Soft / non-tender  Hold (Positioning): Assistance needed to correctly position infant at breast and maintain latch.  LATCH Score: 7  Interventions Interventions: Breast feeding basics reviewed;Assisted with latch;Skin to skin;Breast massage;Hand express;Breast compression;DEBP;Adjust position  Lactation Tools Discussed/Used Pump Review: Setup, frequency, and cleaning;Milk Storage Initiated by:: MPeck Date initiated:: 01/24/20   Consult Status Consult Status: Follow-up Date: 01/25/20 Follow-up type: In-patient    Venna Berberich Venetia Constable 01/24/2020, 6:51 PM

## 2020-01-25 MED ORDER — FERROUS SULFATE 325 (65 FE) MG PO TABS: 325.0000 mg | ORAL_TABLET | ORAL | 0 refills | Status: AC

## 2020-01-25 MED ORDER — IBUPROFEN 600 MG PO TABS: 600.0000 mg | ORAL_TABLET | Freq: Three times a day (TID) | ORAL | 0 refills | Status: AC | PRN

## 2020-01-25 MED ORDER — ACETAMINOPHEN 325 MG PO TABS: 650.0000 mg | ORAL_TABLET | Freq: Four times a day (QID) | ORAL | 0 refills | Status: AC | PRN

## 2020-01-25 MED ORDER — SENNOSIDES-DOCUSATE SODIUM 8.6-50 MG PO TABS: 2.0000 | ORAL_TABLET | ORAL | 0 refills | Status: AC

## 2020-01-25 NOTE — Lactation Note (Addendum)
This note was copied from a baby's chart. Lactation Consultation Note  Patient Name: Colleen Wilcox [id number] Date: 01/25/2020  P1, term 43 hour female infant being supplemented with donor breast milk, -7% weight loss and previous history of sleepy infant with painful latches at breast ( questionable milk transfer) . Per mom, she has been  having pain and pinching with latches, mom is short shafted and small nipples, edema in areola mom fitted with 20 mm NS, infant latched on left breast using the football hold.  LC ask mom wait until infant mouth is wide, nose and chin touching breast, per mom latch in not painful no pinching with this latch, infant breastfeed for 27 minutes and colostrum present in 20 mm NS.  Per mom, she has not used DEBP, LC reinforced importance of pumping to help establish mom's milk supply. Mom will start pumping every 3 hours for 15 minutes on initial setting mom fitted with 21 mm breast flange due to having small short shafted nipples. LC reviewed ways to keep infant awake at breast, breast compressions, rubbing infant's neck and shoulder, talking to infant, burping infant and placing back at breast for longer breastfeeding duration.  LC reviewed hand expression and mom expressed 3 mls of colostrum out of right breast in bullet that she will offer infant at next feeding. Infant had emesis episode, large thick mucous, infant was suction and burped, per mom, she had a very fast labour.   Infant was given 12 mls of donor milk, LC changed a void and as LC was leaving infant had a large stool. Per mom, this is first time she felt infant has breast fed well.  Mom will continue to use 20 mm NS due breast soreness and pinching she is feeling when infant is latched, mom will continue to work on latching infant at breast. Mom was given coconut oil and comfort gels by RN and understands not to use them together. Mom knows to call RN or LC if she has questions, concerns or needs  assistance with latching infant at breast. Mom's current plan: 1. Pre-pump breast prior to applying 20 mm NS, latch infant at breast according feeding cues, 8 to 12 times within 24 hours and not exceed 3 hours without breastfeeding infant. 2. Offer infant any EBM first and then donor breast milk according infant's age/ hours of life. 3. Pump every 3 hours for 15 minutes on initial setting and hand express afterwards giving infant any breastmilk that is expressed. 4. Mom will call RN or LC if she needs assistance with latching infant at breast.     Maternal Data    Feeding Feeding Type: Breast Fed  LATCH Score Latch: Grasps breast easily, tongue down, lips flanged, rhythmical sucking.  Audible Swallowing: Spontaneous and intermittent  Type of Nipple: Everted at rest and after stimulation(short shafted, nipple pain with latch)  Comfort (Breast/Nipple): Filling, red/small blisters or bruises, mild/mod discomfort  Hold (Positioning): Assistance needed to correctly position infant at breast and maintain latch.  LATCH Score: 8  Interventions Interventions: Assisted with latch;Breast massage;Hand express;Adjust position;Support pillows;Position options;Expressed milk;Coconut oil;Comfort gels;Hand pump;DEBP  Lactation Tools Discussed/Used Tools: Pump;Nipple Dorris Carnes;Bottle Nipple shield size: 20 Breast pump type: Double-Electric Breast Pump;Manual   Consult Status Consult Status: Follow-up Date: 01/25/20 Follow-up type: In-patient    Danelle Earthly 01/25/2020, 1:22 AM

## 2020-01-25 NOTE — Lactation Note (Signed)
This note was copied from a baby's chart. Lactation Consultation Note  Patient Name: Colleen Wilcox [id number] Date: 01/25/2020 Reason for consult: Follow-up assessment   P1 mother whose infant is now 35 hours old.  This is a term baby at 40+0 weeks.  Baby has a 9% weight loss this morning.  Mother will continue to breast feed, supplement and post pump today, feeding at least 8-12 times/24 hours and on cue.  Discussed the weight loss with mother and how to best get some relaxation time between feeding and pumping.  Mother feels like baby has started to feed better.  Encouraged hand expression before/after feeding.  Father will feed baby the donor milk supplement while mother pumps for 15 minutes.  She will feed back any EBM she obtains to baby.  Mother has been obtaining drops of colostrum from the right breast and approximately 5 mls from the left breast.  Encouraged mother to be consistent with feeding and pumping to help minimize further weight loss for tomorrow a.m.  Mother verbalized understanding of the plan and will be able to follow through with this plan throughout the day/night.  Encouraged mother to feed more than the supplementation guidelines if baby desires.    Mother will call for any further questions/concerns.  She will call her RN/LC for latch assistance as needed.  Support person present.  Mother has a DEBP for home use.      Maternal Data    Feeding Feeding Type: Breast Milk with Donor Milk  LATCH Score                   Interventions    Lactation Tools Discussed/Used     Consult Status Consult Status: Follow-up Date: 01/26/20 Follow-up type: In-patient    Jeffree Cazeau R Keyon Liller 01/25/2020, 11:36 AM

## 2020-01-26 ENCOUNTER — Encounter: Payer: Medicaid Other | Admitting: Obstetrics & Gynecology

## 2020-01-26 ENCOUNTER — Ambulatory Visit: Payer: Self-pay

## 2020-01-26 NOTE — Lactation Note (Signed)
This note was copied from a baby's chart. Lactation Consultation Note  Patient Name: Colleen Wilcox [id number] Date: 01/26/2020 Reason for consult: Follow-up assessment Baby is 75 hours old/7% weight loss.  Baby has gained weight since yesterday.  Mom reports that baby is feeding well.  She is no longer using nipple shield.  Breasts are becoming full but not engorged.  Discussed milk coming to volume and the prevention and treatment of engorgement.  Mom has a breast pump at home and will continue to post pump and give expressed milk to baby.  Mom last pumped 24 mls.  No questions or concerns.  Reviewed outpatient services and encouraged to call prn.  Maternal Data    Feeding Feeding Type: Breast Fed  LATCH Score                   Interventions    Lactation Tools Discussed/Used     Consult Status Consult Status: Complete Follow-up type: Call as needed    Huston Foley 01/26/2020, 9:03 AM

## 2020-01-28 ENCOUNTER — Inpatient Hospital Stay (HOSPITAL_COMMUNITY)
Admission: AD | Admit: 2020-01-28 | Discharge: 2020-01-29 | Disposition: A | Discharge status: Home / Self Care | Payer: Medicaid Other | Attending: Obstetrics and Gynecology | Admitting: Obstetrics and Gynecology

## 2020-01-28 ENCOUNTER — Encounter (HOSPITAL_COMMUNITY): Payer: Self-pay | Admitting: Obstetrics and Gynecology

## 2020-01-28 DIAGNOSIS — N939 Abnormal uterine and vaginal bleeding, unspecified: Secondary | ICD-10-CM

## 2020-01-28 NOTE — MAU Note (Signed)
Tonight got up and went to BR and pad was sticking to me. When I pulled pad down a large clot came out. Have been in bed most of the day with the baby. No pain. Pt got alittle light headed once she got to hosp. States had a lot of bleeding after delivery.

## 2020-01-29 ENCOUNTER — Inpatient Hospital Stay (HOSPITAL_COMMUNITY): Payer: Medicaid Other

## 2020-01-29 DIAGNOSIS — N939 Abnormal uterine and vaginal bleeding, unspecified: Secondary | ICD-10-CM | POA: Diagnosis not present

## 2020-01-29 LAB — CBC WITH DIFFERENTIAL/PLATELET
Abs Immature Granulocytes: 0.05 10*3/uL (ref 0.00–0.07)
Basophils Absolute: 0 10*3/uL (ref 0.0–0.1)
Basophils Relative: 0 %
Eosinophils Absolute: 0.1 10*3/uL (ref 0.0–0.5)
Eosinophils Relative: 1 %
HCT: 35.5 % — ABNORMAL LOW (ref 36.0–46.0)
Hemoglobin: 11.6 g/dL — ABNORMAL LOW (ref 12.0–15.0)
Immature Granulocytes: 1 %
Lymphocytes Relative: 29 %
Lymphs Abs: 2.9 10*3/uL (ref 0.7–4.0)
MCH: 26.7 pg (ref 26.0–34.0)
MCHC: 32.7 g/dL (ref 30.0–36.0)
MCV: 81.8 fL (ref 80.0–100.0)
Monocytes Absolute: 0.8 10*3/uL (ref 0.1–1.0)
Monocytes Relative: 8 %
Neutro Abs: 6.2 10*3/uL (ref 1.7–7.7)
Neutrophils Relative %: 61 %
Platelets: 187 10*3/uL (ref 150–400)
RBC: 4.34 MIL/uL (ref 3.87–5.11)
RDW: 14.6 % (ref 11.5–15.5)
WBC: 10.1 10*3/uL (ref 4.0–10.5)
nRBC: 0 % (ref 0.0–0.2)

## 2020-01-29 NOTE — MAU Note (Signed)
Pt having u/s in her room per u/s preference

## 2020-01-29 NOTE — MAU Provider Note (Signed)
History     CSN: [id number]  Arrival date and time: 01/28/20 2304   First Provider Initiated Contact with Patient 01/29/20 0038      Chief Complaint  Patient presents with  . Vaginal Bleeding   Jacelyn Cuen is a 29 y.o. G1P1001 at who receives care at [facility].  She presents today for Vaginal Bleeding.  She states around 9pm she passed a large clot after laying in the bed and standing up.  Patient states her bleeding had been "lightening up and getting better." She states the bleeding remained "fine" afterwards. She states that she is breastfeeding and has been having no issues.  Patient reports that she is taking tylenol, ibuprofen, iron supplement, and PNV.  Patient denies issues with urination and constipation.  She states she had diarrhea yesterday, but "I think I was overdoing it on the stool softeners."  Patient denies any issues today.       OB History    Gravida  1   Para  1   Term  1   Preterm      AB      Living  1     SAB      TAB      Ectopic      Multiple  0   Live Births  1           History reviewed. No pertinent past medical history.  Past Surgical History:  Procedure Laterality Date  . MOUTH SURGERY      Family History  Problem Relation Age of Onset  . Head & neck cancer Mother     Social History   Tobacco Use  . Smoking status: Never Smoker  . Smokeless tobacco: Never Used  Substance Use Topics  . Alcohol use: Not Currently  . Drug use: Never    Allergies: No Known Allergies  Medications Prior to Admission  Medication Sig Dispense Refill Last Dose  . acetaminophen (TYLENOL) 325 MG tablet Take 2 tablets (650 mg total) by mouth every 6 (six) hours as needed (for pain scale < 4). 30 tablet 0 01/28/2020 at Unknown time  . ferrous sulfate (FERROUSUL) 325 (65 FE) MG tablet Take 1 tablet (325 mg total) by mouth every other day. 60 tablet 0 01/28/2020 at Unknown time  . ibuprofen (ADVIL) 600 MG tablet Take 1 tablet (600 mg total) by  mouth every 8 (eight) hours as needed for mild pain. 30 tablet 0 01/28/2020 at Unknown time  . Prenatal Vit-Fe Fumarate-FA (PRENATAL VITAMINS PO) Take by mouth.   01/28/2020 at Unknown time  . senna-docusate (SENOKOT-S) 8.6-50 MG tablet Take 2 tablets by mouth daily. 30 tablet 0 Past Week at Unknown time  . Blood Pressure Monitoring (BLOOD PRESSURE KIT) DEVI 1 Device by Does not apply route once a week. 1 Device 0     Review of Systems  Constitutional: Negative for chills and fever.  Eyes: Positive for visual disturbance (Seeing spots and feeling faint in lobby.).  Respiratory: Negative for cough and shortness of breath.   Gastrointestinal: Negative for abdominal pain, nausea and vomiting.  Genitourinary: Positive for pelvic pain (Prior to passing clot) and vaginal bleeding. Negative for difficulty urinating, dysuria and vaginal discharge.  Musculoskeletal: Positive for back pain.  Neurological: Positive for syncope (Seeing spots and feeling faint.). Negative for dizziness, light-headedness and headaches.   Physical Exam   Blood pressure 123/76, pulse 66, temperature 99.1 F (37.3 C), temperature source Oral, resp. rate 18, SpO2 100 %, currently  breastfeeding.  Physical Exam  Constitutional: She is oriented to person, place, and time. She appears well-developed and well-nourished.  HENT:  Head: Normocephalic and atraumatic.  Eyes: Conjunctivae are normal.  Cardiovascular: Normal rate.  Respiratory: Effort normal.  GI: Soft.  Genitourinary:    Vaginal bleeding present.  There is bleeding in the vagina.    Genitourinary Comments: Speculum Exam Deferred Visual Exam: -External Genitalia: Laceration approximated and healing well. Sutures noted at introitus.  Labial lacerations healing well and approximated. No s/s of infection.  -Bimanual Exam:  Deferred    Musculoskeletal:        General: Normal range of motion.     Cervical back: Normal range of motion.  Neurological: She is alert  and oriented to person, place, and time.  Skin: Skin is warm and dry.  Psychiatric: She has a normal mood and affect. Her behavior is normal.    MAU Course  Procedures Results for orders placed or performed during the hospital encounter of 01/28/20 (from the past 24 hour(s))  CBC with Differential/Platelet     Status: Abnormal   Collection Time: 01/29/20 12:58 AM  Result Value Ref Range   WBC 10.1 4.0 - 10.5 K/uL   RBC 4.34 3.87 - 5.11 MIL/uL   Hemoglobin 11.6 (L) 12.0 - 15.0 g/dL   HCT 35.5 (L) 36.0 - 46.0 %   MCV 81.8 80.0 - 100.0 fL   MCH 26.7 26.0 - 34.0 pg   MCHC 32.7 30.0 - 36.0 g/dL   RDW 14.6 11.5 - 15.5 %   Platelets 187 150 - 400 K/uL   nRBC 0.0 0.0 - 0.2 %   Neutrophils Relative % 61 %   Neutro Abs 6.2 1.7 - 7.7 K/uL   Lymphocytes Relative 29 %   Lymphs Abs 2.9 0.7 - 4.0 K/uL   Monocytes Relative 8 %   Monocytes Absolute 0.8 0.1 - 1.0 K/uL   Eosinophils Relative 1 %   Eosinophils Absolute 0.1 0.0 - 0.5 K/uL   Basophils Relative 0 %   Basophils Absolute 0.0 0.0 - 0.1 K/uL   Immature Granulocytes 1 %   Abs Immature Granulocytes 0.05 0.00 - 0.07 K/uL   US PELVIC COMPLETE WITH TRANSVAGINAL  Result Date: 01/29/2020 CLINICAL DATA:  Four days postpartum with persistent vaginal bleeding EXAM: TRANSABDOMINAL AND TRANSVAGINAL ULTRASOUND OF PELVIS TECHNIQUE: Both transabdominal and transvaginal ultrasound examinations of the pelvis were performed. Transabdominal technique was performed for global imaging of the pelvis including uterus, ovaries, adnexal regions, and pelvic cul-de-sac. It was necessary to proceed with endovaginal exam following the transabdominal exam to visualize the ovaries. COMPARISON:  None FINDINGS: Uterus Measurements: 16.0 x 6.8 x 11.6 cm = volume: 660 mL. Uterus is enlarged and boggy consistent with the postpartum state. Endometrium Thickness: 22 mm. Hypoechoic material is noted within the endometrial canal. No increased vascularity is identified to suggest  retained products of conception. Given the patient's clinical history, this likely represents some retained clot as opposed to retained products of conception. Right ovary Measurements: 3.2 x 1.6 x 1.6 cm = volume: 4.4 mL. Normal appearance/no adnexal mass. Left ovary Measurements: 3.0 x 1.5 x 2.3 cm = volume: 5.4 mL. Normal appearance/no adnexal mass. Other findings No abnormal free fluid. IMPRESSION: Hypoechoic area with material within the endometrial canal likely representing some thrombus/debris given the patient's clinical history. No increased vascularity is identified to suggest retained products of conception. If patient's clinical symptomatology persists for greater than 7 days repeat ultrasound may be helpful for  further evaluation. Electronically Signed   By: Inez Catalina M.D.   On: 01/29/2020 02:15      MDM Pelvic Exam Ultrasound Assessment and Plan  29 year old, G1P1001  6 Days Postpartum Retained POC  -Reviewed POC with patient. -Exam performed and findings discussed.  -Informed that products appears to be trailing membranes.  -Will send for [place]. -Labs ordered.   Maryann Conners 01/29/2020, 12:38 AM   Reassessment (2:34 AM)  -[place] returns without findings suspicious for RPOC, but retained clot. -Results discussed with patient and informed of possibility of passing of clots.  -Reviewed bleeding precautions.  -Encouraged to monitor and call or return to MAU if symptoms worsen or with the onset of new symptoms. -Patient questions when appt for PPV will be made.  Informed since no desire for contraception it may be virtual, but office should call soon to schedule accordingly. -Patient without further questions or concerns. -Discharged to home in stable condition.  Maryann Conners MSN, CNM Advanced Practice Provider, Center for [employer] [facility]

## 2020-01-29 NOTE — Progress Notes (Signed)
Gerrit Heck CNM in to see pt. Perineum visually examined. Pt very sore due to repair after delivery. Aware may have to use vaginal probe for u/s. Agrees with POC.

## 2020-01-29 NOTE — Discharge Instructions (Signed)
Postpartum Hemorrhage °Postpartum hemorrhage is excessive blood loss after childbirth. Vaginal bleeding after delivery is normal and should be expected. Bleeding (lochia) will occur for several days after childbirth. This can be expected with normal vaginal deliveries and cesarean deliveries. However, postpartum hemorrhage is a potentially serious condition. °What are the causes? °This condition is caused by: °· A loss of muscle tone in the uterus after childbirth. This can be caused by: °? An abnormal placenta. °? Infection. °? Bladder swelling (distension). °· Failure to deliver all of the placenta or the retention of clots. °· Wounds in the birth canal caused by delivery of the fetus. °· Infection of the uterus. °· Infection of tissue around the fetus. °· A tear in the uterus. °· Tearing of the vagina or cervix during delivery. °· A maternal bleeding disorder that prevents blood from clotting (rare). °What increases the risk? °This condition is likely to develop in people who: °· Have a history of postpartum hemorrhage. °· Had a delivery that lasted longer than usual. °· Have an excess of amniotic fluid in the amniotic sac (polyhydramnios), leading the uterus to stretch too much. °· Have delivered quintuplets or more babies. °· Had high blood pressure, seizures, or coma during pregnancy. °· Had a condition called preeclampsia or eclampsia during pregnancy. °· Had problems with the placenta. °· Had complications during labor or delivery. °· Are obese. °· Are 40 years old or older. °· Are Asian or Hispanic. °What are the signs or symptoms? °Symptoms of this condition include: °· Passing large clots or pieces of tissue. These may be small pieces of placenta left after delivery. °· Soaking more than one sanitary pad per hour for several hours. °· Heavy, bright-red bleeding that occurs 4 days or more after delivery. °· Discharge that has a bad smell. °· An unexplained fever. °· Nausea or vomiting. °· Pain or swelling  near the vagina or perineum. °· A drop in blood pressure. °· Lightheadedness or fainting. °· Shortness of breath. °· A fast heart rate that happens with very little activity. °· Signs of shock, such as: °? Blurry vision. °? Chills. °? Dizziness. °? Weakness. °How is this diagnosed? °This condition may be diagnosed based on: °· Your symptoms. °· A physical exam of your perineum, vagina, cervix, and uterus. °· Tests, including: °? Blood pressure and pulse measurements. °? Blood tests. °? Blood clotting tests. °? Ultrasonography. °How is this treated? °Treatment for this condition depends on the severity of your symptoms. It may include: °· Uterine massage. °· Medicines to help the uterus contract. °· Blood transfusions. °· Fluids given through the vein. °· A medical procedure to compress arteries supplying the uterus. °Sometimes bleeding occurs if portions of the placenta are left behind in the uterus after delivery. If this happens, a curettage or scraping of the inside of the uterus must be done (rare). This usually stops the bleeding. If curettage does not stop the bleeding, surgery may be done to remove the uterus (hysterectomy), but this rarely occurs. °If bleeding is due to clotting or bleeding problems that are not related to the pregnancy, other treatments may be needed. °Follow these instructions at home: °· Limit your activity as directed by your health care provider. Your health care provider may order bed rest (getting up to go to the bathroom only) or may allow you to continue light activity. °· Keep track of the number of pads you use each day and how soaked (saturated) they are. Write down this information. °· Do not   use tampons. °· Do not douche or have sexual intercourse until your health care provider approves. °· Drink enough fluids to keep your urine clear or pale yellow. °· Get enough rest. °· Eat foods that are rich in iron, such as spinach, red meat, and legumes. °· Take any over-the-counter and  prescription medicines only as told by your health care provider. °· Keep all follow-up visits as told by your health care provider. This is important. °Get help right away if: °· You experience severe cramps in your stomach, back, or abdomen. °· You have a fever. °· You pass large clots or tissue. Save any tissue for your health care provider to look at. °· Your bleeding increases. °· You have heavy bleeding that soaks one pad per hour for 2 hours in a row. °· You faint or become dizzy, weak, or lightheaded. °· Your sanitary pad count per hour is increasing. °· You are urinating less than usual or not at all. °· You have shortness of breath. °· Your heart rate is faster than usual. °· You have sudden chest pain. °This information is not intended to replace advice given to you by your health care provider. Make sure you discuss any questions you have with your health care provider. °Document Revised: 09/28/2017 Document Reviewed: 05/19/2016 °Elsevier Patient Education © 2020 [employer] ° °

## 2020-01-30 LAB — SURGICAL PATHOLOGY

## 2020-02-20 ENCOUNTER — Ambulatory Visit (INDEPENDENT_AMBULATORY_CARE_PROVIDER_SITE_OTHER): Payer: Medicaid Other | Admitting: Women's Health

## 2020-02-20 ENCOUNTER — Other Ambulatory Visit (HOSPITAL_COMMUNITY)
Admission: RE | Admit: 2020-02-20 | Discharge: 2020-02-20 | Disposition: A | Discharge status: Home / Self Care | Payer: Medicaid Other | Source: Ambulatory Visit | Attending: Women's Health | Admitting: Women's Health

## 2020-02-20 ENCOUNTER — Other Ambulatory Visit: Payer: Self-pay

## 2020-02-20 ENCOUNTER — Encounter: Payer: Self-pay | Admitting: Women's Health

## 2020-02-20 DIAGNOSIS — Z124 Encounter for screening for malignant neoplasm of cervix: Secondary | ICD-10-CM | POA: Diagnosis present

## 2020-02-20 NOTE — Progress Notes (Signed)
    Post Partum Visit Note  Colleen Wilcox is a 29 y.o. G10P1001 female who presents for a postpartum visit. She is 4 weeks postpartum following a normal spontaneous vaginal delivery.  I have fully reviewed the prenatal and intrapartum course. The delivery was at 40 gestational weeks.  Anesthesia: none. Postpartum course has been unremarkable. Baby is doing well. Baby is feeding by breast. Bleeding no bleeding. Bowel function is normal. Bladder function is normal. Patient is not sexually active. Contraception method is none. Postpartum depression screening: negative.  The following portions of the patient's history were reviewed and updated as appropriate: allergies, current medications, past family history, past medical history, past social history, past surgical history and problem list.  Review of Systems Pertinent items noted in HPI and remainder of comprehensive ROS otherwise negative.    Objective:  Weight 141 lb (64 kg), currently breastfeeding.  General:  alert, cooperative and no distress   Breasts:  pt declines exam  Lungs: clear to auscultation bilaterally  Heart:  regular rate and rhythm, S1, S2 normal, no murmur, click, rub or gallop  Abdomen: soft, non-tender; bowel sounds normal; no masses,  no organomegaly   Vulva:  normal  Vagina: normal vagina, no discharge, exudate, lesion, or erythema  Cervix:  no bleeding following Pap, no cervical motion tenderness and no lesions  Corpus: normal  Adnexa:  normal adnexa and no mass, fullness, tenderness  Rectal Exam: Not performed.        Assessment:    Normal postpartum exam. Pap smear done at today's visit.   Plan:   Essential components of care per ACOG recommendations:  1.  Mood and well being: Patient with negative depression screening today. Reviewed local resources for support.  - Patient does not use tobacco. - hx of drug use? No   2. Infant care and feeding:  -Patient currently breastmilk feeding? Yes  -discussed  s/sx of mastitis and when to return for care -Social determinants of health (SDOH) reviewed in EPIC. No concerns  3. Sexuality, contraception and birth spacing - Patient does not want a pregnancy in the next year.  Desired family size is unsure number of children.  - Reviewed forms of contraception in tiered fashion. Patient desired condoms today.   - Discussed birth spacing of 18 months  4. Sleep and fatigue -Encouraged family/partner/community support of 4 hrs of uninterrupted sleep to help with mood and fatigue  5. Physical Recovery  - Discussed patients delivery and complications - Patient had a 2nd degree laceration, perineal healing reviewed. Patient expressed understanding - Patient has urinary incontinence? No - Patient is safe to resume physical and sexual activity  6.  Health Maintenance - Last pap smear done unsure and was normal. - Pap obtained today  7. Chronic Disease - PCP follow up - PCP list given  Marylen Ponto, NP  9:15 AM 02/20/2020 Center for [employer], [facility]

## 2020-02-20 NOTE — Patient Instructions (Addendum)
AREA FAMILY PRACTICE PHYSICIANS  [place] ([address]) . [facility] o [address] [place], [place] [address] o [phone] o Mon-Fri 8:30-12:30, 1:30-5:00 o Accepting Medicaid . [facility] o [address], [place], [place] [address] o [phone] o Mon-Fri 8:00-5:30 . [facility] o [address] [place], [place] [address] o [phone] o Mon, Tue, Thur, Fri 8:30-5:00, Wed 10:00-7:00 (closed 1-2pm) o Accepting Medicaid . [facility] o [address], [place], [place]  [address] o Phone - [phone]   Fax - [phone]  [place] ([address]) . [facility] o [address] [place], [place] [address] o [phone] o Mon-Fri 8:00-5:00 . [facility]  o [address] [place], [place] [address] o [phone] o Mon-Fri 8:30-5:30, Sat (Oct.-Mar.) 9:00-1:00 o Accepting Medicaid  [place] ([address]) . [facility] o [address], [place], [place] [address] o [phone] o Mon-Fri 8:00-5:00  [place] ([address]) . [facility] o [address], [place], [place] [address] o [phone] o Mon-Fri 8:00-5:00 . [facility] o [address], [place], [place] [address] o [phone] o Mon-Fri 8:00-5:00 . [facility] o [address] [place], [place] [address] o [phone] o Mon-Fri 8:00-5:00 . [facility] o [address] [place] [address] o [phone] o Mon-Fri 7:30-5:30  [place] ([address]) . [facility] o [address] [place], [place] [address] o [phone] o Mon-Thur 8:00-6:00 o Accepting Medicaid . [facility] o [address] [place], [place] [address] o [phone] o Mon-Thur 7:30-7:30, Fri 7:30-4:30 o Accepting Medicaid . [facility] o [address], [place], [place]  [address] o [phone]   Fax - [phone]  [place] ([address] & [address]) . [facility] o [address] [place], [place] [address] o [phone] o Mon-Fri 7:00-5:00 . [facility] o [address], [place], [place] [address] o [phone] o Mon-Fri 8:00-5:00 o Accepting Medicaid . [facility] o [address],  [place], [place] [address] o [phone] o Mon-Fri 8:00-5:00 o Accepting Medicaid  [place] ([address]) . [facility] [place] o [address] [place], [place] [address] o [phone] o Mon-Fri 8:00-5:00 . [facility] o [address], [place], [place] [address] o [phone] o Mon-Fri 8:00-5:00 o Accepting Medicaid . [facility] o [address], [place], [place] [address] o [phone] o Mon-Fri 8:00-5:30, Sat&Sun by appointment (phones open at 8:30) o Accepting Medicaid  [place] ([address]) . [facility] o [address] [place], [place] [address] o [phone] o Mon-Thur 8:00-7:00, Fri 8:00-5:00, Sat 8:00-12:00, Sun 9:00-12:00 o Accepting Medicaid . [facility] [place] o [address], [place], [place] [address] o [phone] o Mon-Thur 8:00-5:00 o Accepting Medicaid . [facility] o [address] [place],  [address] o [phone] o Mon-Fri 8:00-5:30, Sat (Oct.-Mar.) 9:00-1:00 o Accepting Medicaid  [place]  ([address]) .   [facility] o [address] [place], [place] [address] o [phone] o Mon-Fri 8:00-5:00 o Accepting Medicaid   [facility] [phone]) . [facility] o [address], [place], [place] [address] o [phone] o Mon-Fri 8:00-5:00 . Nature conservation officer at [facility] o [address], [place], [place] [address] o [phone] o Mon-Fri 8:00-5:00 . [facility] [place] o [address], [place], [place] [address] o [phone] o Mon-Fri 8:00-5:00 o After hours clinic [facility] [place], [place] [address]) [phone] Mon-Fri 5:00-8:00, Sat 12:00-6:00, Sun 10:00-4:00 o Accepting Medicaid . [facility] o [address] [place], [place]  [address] o [phone]   Fax - [phone]  [place] [phone]) . Nature conservation officer at [facility] o [address] [place], [place] [address] o [phone] o Mon-Fri 8:00-5:00 . [facility] [place] [facility]) o [address] [place], [place] [address] o [phone] o Mon-Thur 8:00-7:00, Fri 8:00-5:00, Sat 8:00-12:00        Breastfeeding and Human Lactation (4th ed., pp. 262-299). [place], [place]: [employer].">  Breastfeeding and Mastitis  Mastitis is inflammation of the breast tissue. It can occur in women who are breastfeeding. This can make breastfeeding painful. Mastitis will sometimes go away on its own, especially if it is not caused by an infection (non-infectious mastitis). Your health care provider will help determine if medical treatment is needed. Treatment may be needed if the condition is caused by a bacterial infection (infectious mastitis). What are the causes? This condition is often associated with a blocked milkduct, which can happen when too much milk builds up in the breast. Causes of excess milk in the breast can  include:  Poor latch-on. If your baby is not latched onto the breast properly, he or she may not empty your breast completely while breastfeeding.  Allowing too much time to pass between feedings.  Wearing a bra or other clothing that is too tight. This puts extra pressure on the milk ducts so milk does not flow through them as it should.  Milk remaining in the breast because it is overfilled (engorged).  Stress and fatigue. Mastitis can also be caused by a bacterial infection. Bacteria may enter the breast tissue through cuts, cracks, or openings in the skin near the nipple area. Cracks in the skin are often caused when your baby does not latch on properly to the breast. What are the signs or symptoms? Symptoms of this condition include:  Swelling, redness, tenderness, and pain in an area of the breast. This usually affects the upper part of the breast, toward the armpit region. In most cases, it affects only one breast. In some cases, it may occur on both breasts at the same time and affect a larger portion of breast tissue.  Swelling of the glands under the arm on the same side.  Fatigue, headache, and flu-like muscle aches.  Fever.  Rapid pulse. Symptoms usually last 2 to 5 days. Breast pain and redness are at their worst on day 2 and day 3, and they usually go away by day 5. If an infection is left to progress, a collection of pus (abscess) may develop. How is this diagnosed? This condition can be diagnosed based on your symptoms and a physical exam. You may also have tests, such as:  Blood tests to determine  if your body is fighting a bacterial infection.  Mammogram or ultrasound tests to rule out other problems or diseases.  Fluid tests. If an abscess has developed, the fluid in the abscess may be removed with a needle. The fluid may be analyzed to determine if bacteria are present.  Breast milk may be cultured and tested for bacteria. How is this treated? This condition  will sometimes go away on its own. Your health care provider may choose to wait 24 hours after first seeing you to decide whether treatment is needed. If treatment is needed, it may include:  Strategies to manage breastfeeding. This includes continuing to breastfeed or pump in order to allow adequate milk flow, using breast massage, and applying heat or cold to the affected area.  Self-care such as rest and increased fluid intake.  Medicine for pain.  Antibiotic medicine to treat a bacterial infection. This is usually taken by mouth.  If an abscess has developed, it may be treated by removing fluid with a needle. Follow these instructions at home: Medicines  Take over-the-counter and prescription medicines only as told by your health care provider.  If you were prescribed an antibiotic medicine, take it as told by your health care provider. Do not stop taking the antibiotic even if you start to feel better. General instructions  Do not wear a tight or underwire bra. Wear a soft, supportive bra.  Increase your fluid intake, especially if you have a fever.  Get plenty of rest. For breastfeeding:  Continue to empty your breasts as often as possible, either by breastfeeding or using an electric breast pump. This will lower the pressure and the pain that comes with it. Ask your health care provider if changes need to be made to your breastfeeding or pumping routine.  Keep your nipples clean and dry.  During breastfeeding, empty the first breast completely before going to the other breast. If your baby is not emptying your breasts completely, use a breast pump to empty your breasts.  Use breast massage during feeding or pumping sessions.  If directed, apply moist heat to the affected area of your breast right before breastfeeding or pumping. Use the heat source that your health care provider recommends.  If directed, put ice on the affected area of your breast right after breastfeeding  or pumping: ? Put ice in a plastic bag. ? Place a towel between your skin and the bag. ? Leave the ice on for 20 minutes.  If you go back to work, pump your breasts while at work to stay in time with your nursing schedule.  Do not allow your breasts to become engorged. Contact a health care provider if:  You have pus-like discharge from the breast.  You have a fever.  Your symptoms do not improve within 2 days of starting treatment.  Your symptoms return after you have recovered from a breast infection. Get help right away if:  Your pain and swelling are getting worse.  You have pain that is not controlled with medicine.  You have a red line extending from the breast toward your armpit. Summary  Mastitis is inflammation of the breast tissue. It is often caused by a blocked milk duct or bacteria.  This condition may be treated with hot and cold compresses, medicines, self-care, and certain breastfeeding strategies.  If you were prescribed an antibiotic medicine, take it as told by your health care provider. Do not stop taking the antibiotic even if you start to feel  better.  Continue to empty your breasts as often as possible either by breastfeeding or using an electric breast pump. This information is not intended to replace advice given to you by your health care provider. Make sure you discuss any questions you have with your health care provider. Document Revised: 07/05/2018 Document Reviewed: 10/17/2016 Elsevier Patient Education  2020 [employer].

## 2020-02-24 LAB — CYTOLOGY - PAP: Diagnosis: NEGATIVE

## 2020-12-24 IMAGING — US US PELVIS COMPLETE WITH TRANSVAGINAL
2 series · 15 of 25 positions shown · non-contrast
Comparison: None

CLINICAL DATA: Four days postpartum with persistent vaginal
bleeding



[Series 1: us pelvis complete with transvaginal · 25 acquisitions, 13 frames shown (1 of 2)]
[im 1/25]
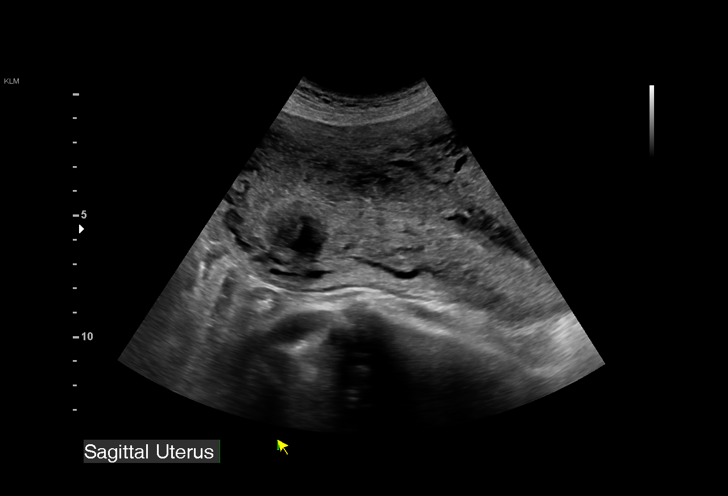
[im 3/25]
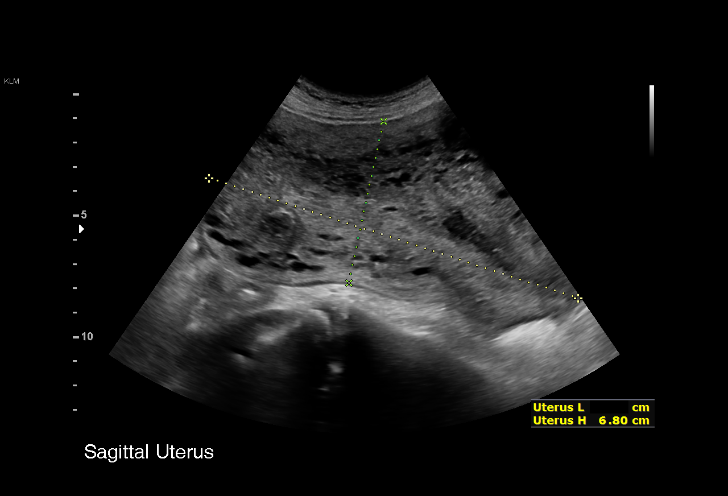
[im 5/25]
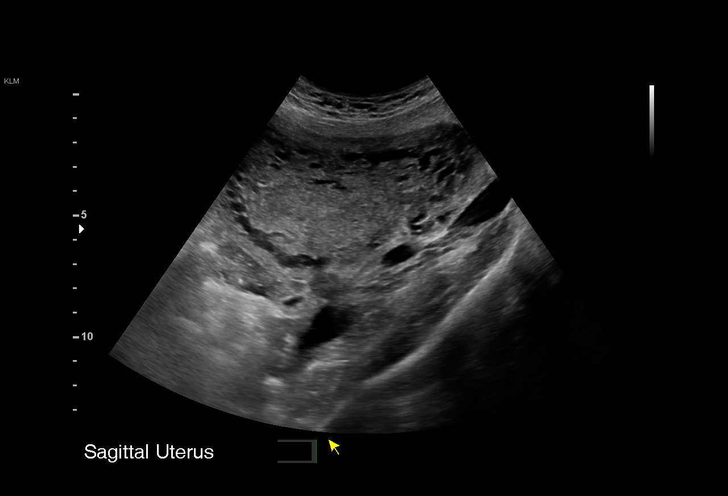
[im 6/25]
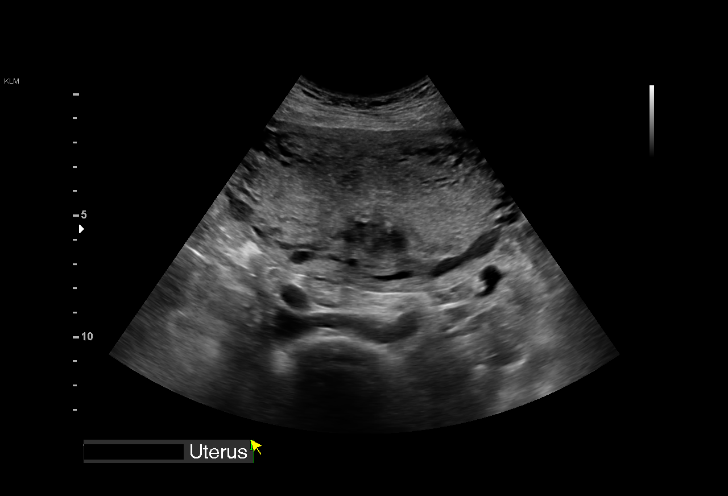
[im 9/25]
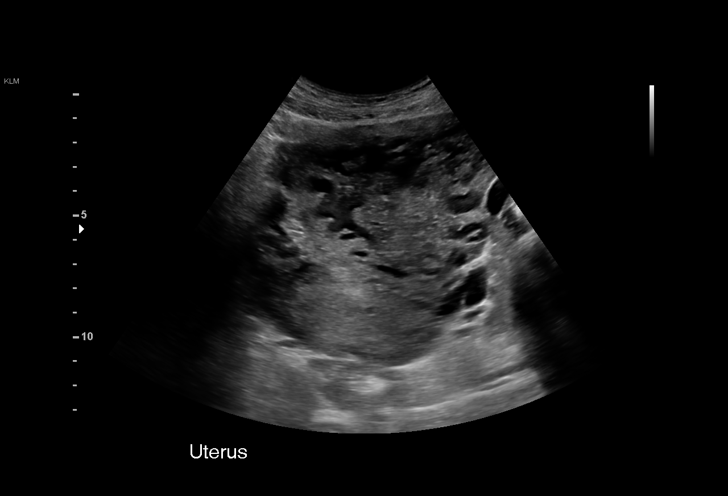
[im 11/25]
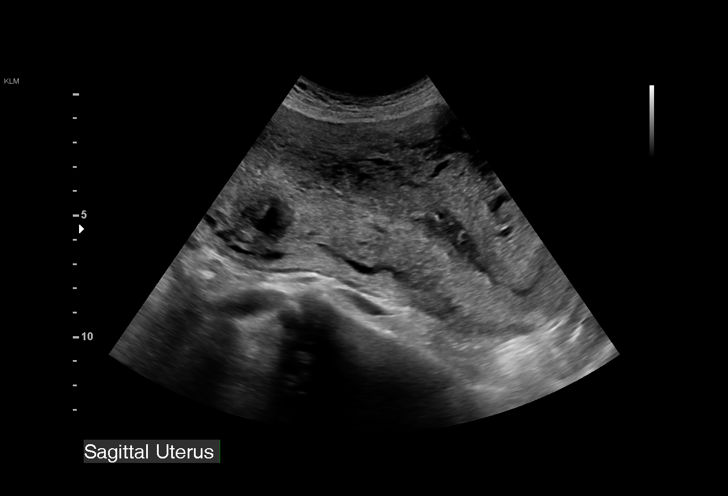
[im 12/25]
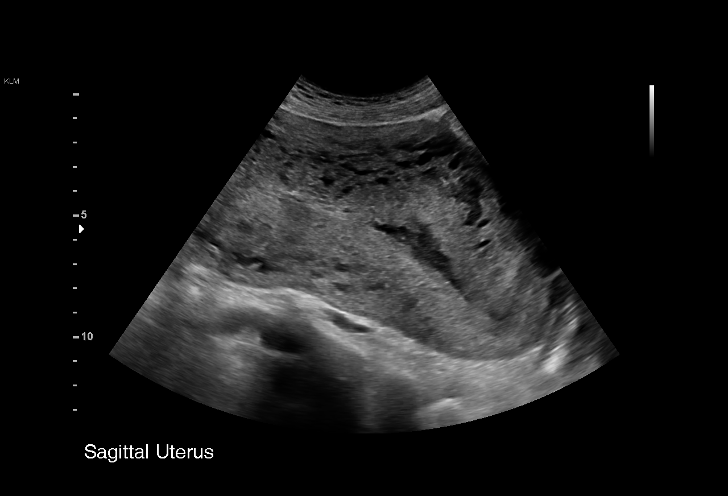
[im 14/25]
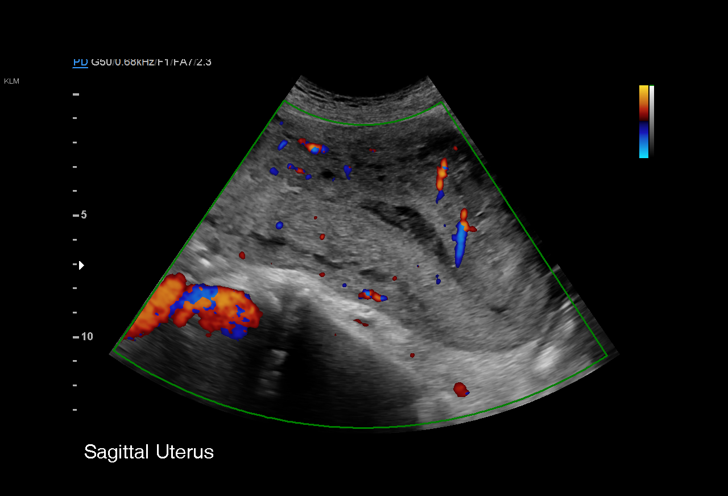
[im 17/25]
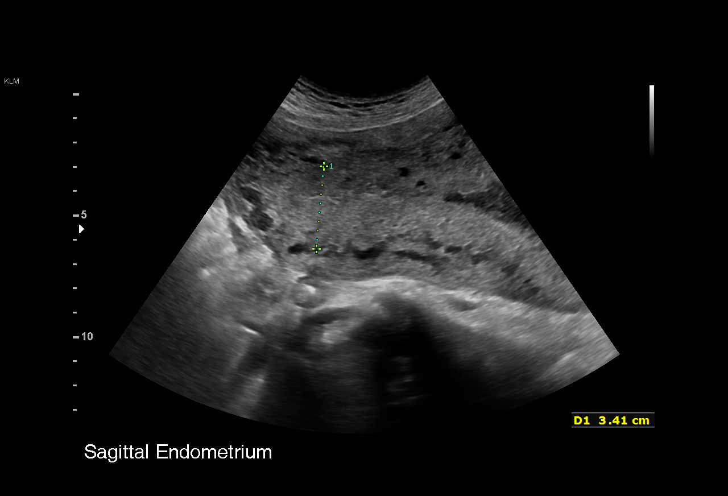
[im 18/25]
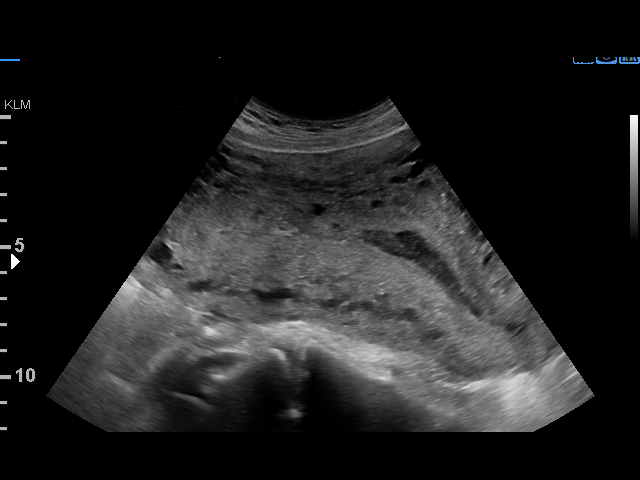
[im 20/25]
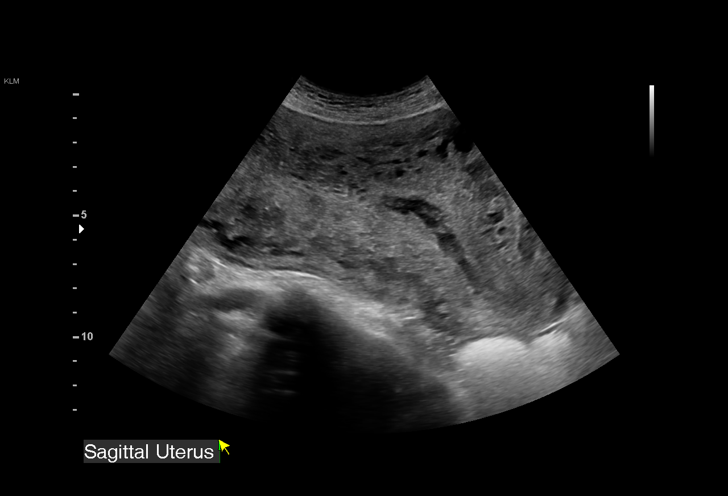
[im 22/25]
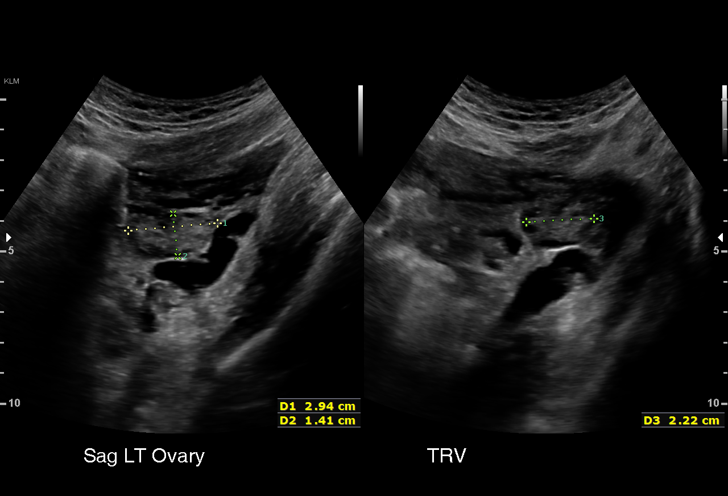
[im 23/25]
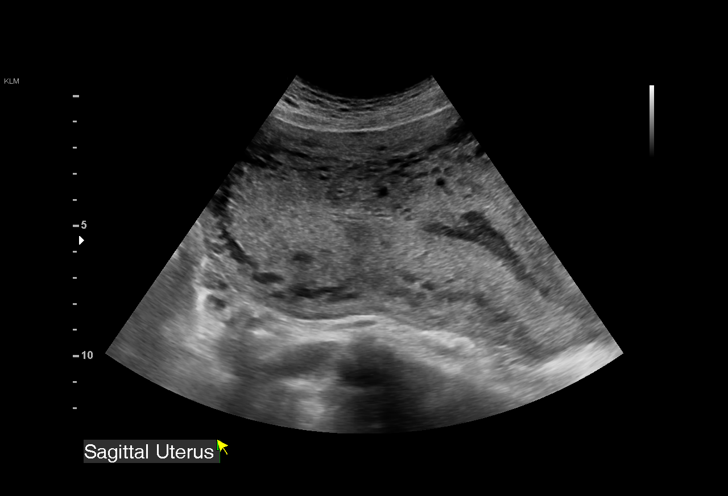

[Series 2: us pelvis complete with transvaginal · 2 of 3 slices shown (2 of 2)]
[im 1/3]
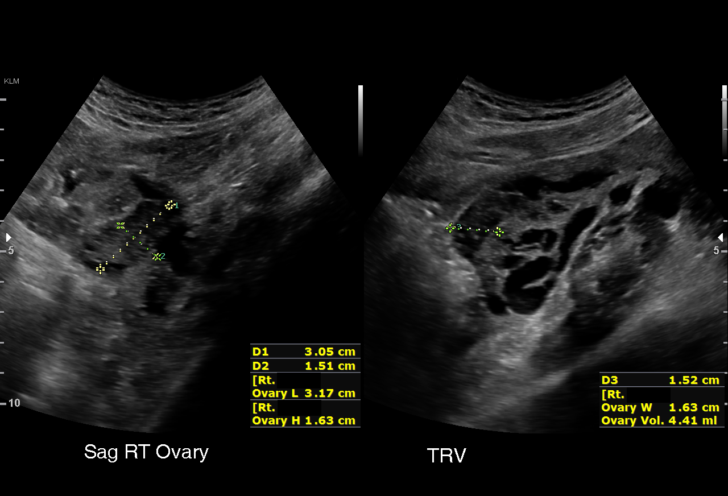
[im 3/3]
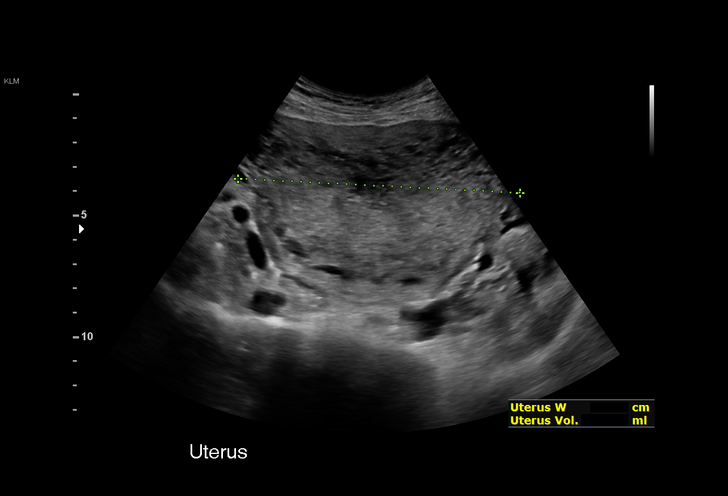

[15 of 25 positions shown; findings below may reference images not displayed]

FINDINGS: Uterus

Measurements: 16.0 x 6.8 x 11.6 cm = volume: 660 mL. Uterus is
enlarged and boggy consistent with the postpartum state.

Endometrium

Thickness: 22 mm. Hypoechoic material is noted within the
endometrial canal. No increased vascularity is identified to suggest
retained products of conception. Given the patient's clinical
history, this likely represents some retained clot as opposed to
retained products of conception.

Right ovary

Measurements: 3.2 x 1.6 x 1.6 cm = volume: 4.4 mL. Normal
appearance/no adnexal mass.

Left ovary

Measurements: 3.0 x 1.5 x 2.3 cm = volume: 5.4 mL. Normal
appearance/no adnexal mass.

Other findings

No abnormal free fluid.
IMPRESSION: Hypoechoic area with material within the endometrial canal likely
representing some thrombus/debris given the patient's clinical
history. No increased vascularity is identified to suggest retained
products of conception.

If patient's clinical symptomatology persists for greater than 7
days repeat ultrasound may be helpful for further evaluation.
